# Patient Record
Sex: Female | Born: 1990 | Race: White | Hispanic: No | Marital: Married | Smoking: Never smoker
Health system: Southern US, Community
[De-identification: ages and names within clinical notes are randomized; demographics above are authoritative.]

## PROBLEM LIST (undated history)

## (undated) DIAGNOSIS — I10 Essential (primary) hypertension: Secondary | ICD-10-CM

## (undated) DIAGNOSIS — F32A Depression, unspecified: Secondary | ICD-10-CM

## (undated) DIAGNOSIS — F329 Major depressive disorder, single episode, unspecified: Secondary | ICD-10-CM

## (undated) DIAGNOSIS — F419 Anxiety disorder, unspecified: Secondary | ICD-10-CM

## (undated) DIAGNOSIS — G43909 Migraine, unspecified, not intractable, without status migrainosus: Secondary | ICD-10-CM

## (undated) HISTORY — DX: Migraine, unspecified, not intractable, without status migrainosus: G43.909

## (undated) HISTORY — DX: Depression, unspecified: F32.A

## (undated) HISTORY — DX: Major depressive disorder, single episode, unspecified: F32.9

## (undated) HISTORY — DX: Anxiety disorder, unspecified: F41.9

---
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
## 2014-08-31 LAB — HM HIV SCREENING LAB: HM HIV Screening: NEGATIVE

## 2014-08-31 LAB — HM HEPATITIS C SCREENING LAB: HM HEPATITIS C SCREENING: NEGATIVE

## 2014-08-31 LAB — HM PAP SMEAR

## 2017-12-19 ENCOUNTER — Encounter: Payer: Self-pay | Admitting: Physician Assistant

## 2017-12-19 ENCOUNTER — Other Ambulatory Visit (INDEPENDENT_AMBULATORY_CARE_PROVIDER_SITE_OTHER): Payer: BC Managed Care – PPO

## 2017-12-19 ENCOUNTER — Ambulatory Visit: Payer: BC Managed Care – PPO | Admitting: Physician Assistant

## 2017-12-19 VITALS — BP 140/90 | HR 80 | Temp 98.1°F | Ht 67.0 in | Wt 208.4 lb

## 2017-12-19 DIAGNOSIS — Z23 Encounter for immunization: Secondary | ICD-10-CM

## 2017-12-19 DIAGNOSIS — Z114 Encounter for screening for human immunodeficiency virus [HIV]: Secondary | ICD-10-CM

## 2017-12-19 DIAGNOSIS — Z136 Encounter for screening for cardiovascular disorders: Secondary | ICD-10-CM | POA: Diagnosis not present

## 2017-12-19 DIAGNOSIS — F419 Anxiety disorder, unspecified: Secondary | ICD-10-CM | POA: Diagnosis not present

## 2017-12-19 DIAGNOSIS — Z13220 Encounter for screening for lipoid disorders: Secondary | ICD-10-CM | POA: Diagnosis not present

## 2017-12-19 DIAGNOSIS — F320 Major depressive disorder, single episode, mild: Secondary | ICD-10-CM

## 2017-12-19 DIAGNOSIS — R7309 Other abnormal glucose: Secondary | ICD-10-CM | POA: Diagnosis not present

## 2017-12-19 DIAGNOSIS — Z0000 Encounter for general adult medical examination without abnormal findings: Secondary | ICD-10-CM | POA: Diagnosis not present

## 2017-12-19 DIAGNOSIS — G43809 Other migraine, not intractable, without status migrainosus: Secondary | ICD-10-CM

## 2017-12-19 DIAGNOSIS — E669 Obesity, unspecified: Secondary | ICD-10-CM | POA: Insufficient documentation

## 2017-12-19 LAB — COMPREHENSIVE METABOLIC PANEL
ALBUMIN: 4.5 g/dL (ref 3.5–5.2)
ALK PHOS: 60 U/L (ref 39–117)
ALT: 20 U/L (ref 0–35)
AST: 19 U/L (ref 0–37)
BILIRUBIN TOTAL: 0.4 mg/dL (ref 0.2–1.2)
BUN: 16 mg/dL (ref 6–23)
CO2: 28 mEq/L (ref 19–32)
Calcium: 9.8 mg/dL (ref 8.4–10.5)
Chloride: 104 mEq/L (ref 96–112)
Creatinine, Ser: 0.84 mg/dL (ref 0.40–1.20)
GFR: 85.16 mL/min (ref >60.00)
GLUCOSE: 113 mg/dL — AB (ref 70–99)
Potassium: 4.1 mEq/L (ref 3.5–5.1)
Sodium: 139 mEq/L (ref 135–145)
TOTAL PROTEIN: 7.7 g/dL (ref 6.0–8.3)

## 2017-12-19 LAB — HEMOGLOBIN A1C: HEMOGLOBIN A1C: 5.5 % (ref 4.6–6.5)

## 2017-12-19 LAB — CBC WITH DIFFERENTIAL/PLATELET
BASOS ABS: 0 10*3/uL (ref 0.0–0.1)
Basophils Relative: 0.4 % (ref 0.0–3.0)
EOS PCT: 3.9 % (ref 0.0–5.0)
Eosinophils Absolute: 0.3 10*3/uL (ref 0.0–0.7)
HEMATOCRIT: 39.7 % (ref 36.0–46.0)
HEMOGLOBIN: 13.3 g/dL (ref 12.0–15.0)
LYMPHS ABS: 2.6 10*3/uL (ref 0.7–4.0)
LYMPHS PCT: 36.7 % (ref 12.0–46.0)
MCHC: 33.6 g/dL (ref 30.0–36.0)
MCV: 88.7 fl (ref 78.0–100.0)
MONOS PCT: 7.9 % (ref 3.0–12.0)
Monocytes Absolute: 0.6 10*3/uL (ref 0.1–1.0)
NEUTROS PCT: 51.1 % (ref 43.0–77.0)
Neutro Abs: 3.7 10*3/uL (ref 1.4–7.7)
Platelets: 331 10*3/uL (ref 150.0–400.0)
RBC: 4.48 Mil/uL (ref 3.87–5.11)
RDW: 13.3 % (ref 11.5–15.5)
WBC: 7.2 10*3/uL (ref 4.0–10.5)

## 2017-12-19 LAB — LIPID PANEL
CHOL/HDL RATIO: 6
Cholesterol: 256 mg/dL — ABNORMAL HIGH (ref 0–200)
HDL: 42.5 mg/dL (ref >39.00)
LDL Cholesterol: 185 mg/dL — ABNORMAL HIGH (ref 0–99)
NONHDL: 213.44
Triglycerides: 144 mg/dL (ref 0.0–149.0)
VLDL: 28.8 mg/dL (ref 0.0–40.0)

## 2017-12-19 NOTE — Addendum Note (Signed)
Addended by: Felix Ahmadi A on: 12/19/2017 01:32 PM   Modules accepted: Orders

## 2017-12-19 NOTE — Addendum Note (Signed)
Addended by: Haynes Bast on: 12/19/2017 01:09 PM   Modules accepted: Orders

## 2017-12-19 NOTE — Progress Notes (Signed)
Joann Hebert is a 27 y.o. female here to Establish Care.  I acted as a Neurosurgeon for [employer], PA-C Corky Mull, LPN  History of Present Illness:   Chief Complaint  Patient presents with  . Establish Care    Acute Concerns: None  Chronic Issues: Migraines -- gets approximately 1-2 x a month, takes OTC medication (Advil) and this is sufficient, usually rests in a dark place as well. Was on Imitrex at one point. Anxiety and depression -- in 2015 had an abortion, and since that time has had depression and anxiety. Did see a therapist for a few years in [place], found it helpful. No hx of SI/HI. Did trial Lexapro and Wellbutrin and did not have good results with these.  Health Maintenance: Immunizations -- UTD, will give Tdap today Colonoscopy -- N/A Mammogram -- N/A PAP -- 09/2016, normal per pt. Will obtain records Bone Density -- N/A Diet -- well balanced diet, occasional soda Caffeine intake -- occasional soda Sleep habits -- has occasional night terrors, and takes clonopin (was on [place] briefly) Exercise -- 4/5 x a week, running and cardio, occasional weight training Weight -- Weight: 208 lb 6.1 oz (94.5 kg) , would like to get under 200 lb Mood -- overall good Alcohol -- very rare Tobacco -- never smoker Periods -- normal, very light -- very regular  Patient's last menstrual period was 12/14/2017.  Depression screen PHQ 2/9 12/19/2017  Decreased Interest 0  Down, Depressed, Hopeless 1  PHQ - 2 Score 1  Altered sleeping 2  Tired, decreased energy 1  Change in appetite 1  Feeling bad or failure about yourself  0  Trouble concentrating 0  Moving slowly or fidgety/restless 0  Suicidal thoughts 0  PHQ-9 Score 5  Difficult doing work/chores Not difficult at all    GAD 7 : Generalized Anxiety Score 12/19/2017  Nervous, Anxious, on Edge 1  Control/stop worrying 2  Worry too much - different things 1  Trouble relaxing 1  Restless 0  Easily annoyed or irritable 1   Afraid - awful might happen 0  Total GAD 7 Score 6  Anxiety Difficulty Not difficult at all    Other providers/specialists: None at this time; was seeing ob-gyn, dentist and therapist in [place]   Past Medical History:  Diagnosis Date  . Anxiety   . Depression   . Migraines    has tried imitrex     Social History   Socioeconomic History  . Marital status: Married    Spouse name: Not on file  . Number of children: Not on file  . Years of education: Not on file  . Highest education level: Not on file  Occupational History  . Not on file  Social Needs  . Financial resource strain: Not on file  . Food insecurity:    Worry: Not on file    Inability: Not on file  . Transportation needs:    Medical: Not on file    Non-medical: Not on file  Tobacco Use  . Smoking status: Never Smoker  . Smokeless tobacco: Never Used  Substance and Sexual Activity  . Alcohol use: Yes    Comment: occassionally  . Drug use: Never  . Sexual activity: Yes    Birth control/protection: None  Lifestyle  . Physical activity:    Days per week: Not on file    Minutes per session: Not on file  . Stress: Not on file  Relationships  . Social connections:  Talks on phone: Not on file    Gets together: Not on file    Attends religious service: Not on file    Active member of club or organization: Not on file    Attends meetings of clubs or organizations: Not on file    Relationship status: Not on file  . Intimate partner violence:    Fear of current or ex partner: Not on file    Emotionally abused: Not on file    Physically abused: Not on file    Forced sexual activity: Not on file  Other Topics Concern  . Not on file  Social History Narrative   Engineer, manufacturing systems   From [place]   Married for 1 year   No children but trying    Past Surgical History:  Procedure Laterality Date  . WISDOM TOOTH EXTRACTION Bilateral 2009    Family History  Problem Relation Age of Onset  .  Depression Father   . Diabetes Father   . Heart attack Father   . Hyperlipidemia Father   . Hypertension Father   . Depression Brother   . Alcohol abuse Maternal Grandmother   . Osteoarthritis Maternal Grandmother   . Diabetes Maternal Grandmother   . Hyperlipidemia Maternal Grandmother   . Hypertension Maternal Grandmother   . Miscarriages / Stillbirths Maternal Grandmother   . Diabetes Maternal Grandfather   . Hyperlipidemia Maternal Grandfather   . Hypertension Maternal Grandfather   . Kidney disease Maternal Grandfather   . Depression Paternal Grandfather   . Heart attack Paternal Grandfather   . Colon cancer Neg Hx   . Breast cancer Neg Hx     No Known Allergies   Current Medications:   Current Outpatient Medications:  .  clonazePAM (KLONOPIN) 1 MG tablet, Take 0.5-1 mg by mouth as needed. , Disp: , Rfl:  .  ELDERBERRY PO, Take 2 each by mouth daily., Disp: , Rfl:  .  ibuprofen (ADVIL,MOTRIN) 200 MG tablet, Take 400-600 mg by mouth as needed., Disp: , Rfl:    Review of Systems:   Review of Systems  Constitutional: Negative for chills, fever, malaise/fatigue and weight loss.  HENT: Negative for hearing loss, sinus pain and sore throat.   Respiratory: Negative for cough and hemoptysis.   Cardiovascular: Negative for chest pain, palpitations, leg swelling and PND.  Gastrointestinal: Negative for abdominal pain, constipation, diarrhea, heartburn, nausea and vomiting.  Genitourinary: Negative for dysuria, frequency and urgency.  Musculoskeletal: Negative for back pain, myalgias and neck pain.  Skin: Negative for itching and rash.  Neurological: Negative for dizziness, tingling, seizures and headaches.  Endo/Heme/Allergies: Negative for polydipsia.  Psychiatric/Behavioral: Negative for depression. The patient is not nervous/anxious.     Vitals:   Vitals:   12/19/17 0751  BP: 140/90  Pulse: 80  Temp: 98.1 F (36.7 C)  TempSrc: Oral  SpO2: 96%  Weight: 208 lb  6.1 oz (94.5 kg)  Height: 5\' 7"  (1.702 m)     Body mass index is 32.64 kg/m.  Physical Exam:   Physical Exam  Constitutional: She appears well-developed and well-nourished. She is cooperative.  Non-toxic appearance. She does not have a sickly appearance. She does not appear ill. No distress.  HENT:  Head: Normocephalic and atraumatic.  Right Ear: Tympanic membrane, external ear and ear canal normal. Tympanic membrane is not erythematous, not retracted and not bulging.  Left Ear: Tympanic membrane, external ear and ear canal normal. Tympanic membrane is not erythematous, not retracted and not bulging.  Eyes: Pupils are equal, round, and reactive to light. Conjunctivae, EOM and lids are normal.  Neck: Trachea normal and full passive range of motion without pain.  Cardiovascular: Normal rate, regular rhythm, S1 normal, S2 normal, normal heart sounds and intact distal pulses.  Pulmonary/Chest: Effort normal and breath sounds normal. No tachypnea. No respiratory distress. She has no decreased breath sounds. She has no wheezes. She has no rhonchi. She has no rales.  Abdominal: Soft. Normal appearance and bowel sounds are normal. There is no tenderness.  Genitourinary:  Genitourinary Comments: Did not perform today  Musculoskeletal: Normal range of motion.  Lymphadenopathy:    She has no cervical adenopathy.  Neurological: She is alert. She has normal reflexes. No cranial nerve deficit or sensory deficit. GCS eye subscore is 4. GCS verbal subscore is 5. GCS motor subscore is 6.  Skin: Skin is warm, dry and intact.  Psychiatric: She has a normal mood and affect. Her speech is normal and behavior is normal.  Nursing note and vitals reviewed.   Assessment and Plan:    Armelia was seen today for establish care.  Diagnoses and all orders for this visit:  Routine physical examination Today patient counseled on age appropriate routine health concerns for screening and prevention, each reviewed  and up to date or declined. Immunizations reviewed and up to date or declined. Labs ordered and reviewed. Risk factors for depression reviewed and negative. Hearing function and visual acuity are intact. ADLs screened and addressed as needed. Functional ability and level of safety reviewed and appropriate. Education, counseling and referrals performed based on assessed risks today. Patient provided with a copy of personalized plan for preventive services.  Need for prophylactic vaccination with combined diphtheria-tetanus-pertussis (DTP) vaccine -     Tdap vaccine greater than or equal to 7yo IM  Anxiety; Depression, major, single episode, mild (HCC) Managed with prn clonopin. Does not need refill, will return for refill when she is due. Encouraged her to find a therapist, provided list of recommendations. -     CBC with Differential/Platelet -     Comprehensive metabolic panel  Encounter for lipid screening for cardiovascular disease -     Lipid panel  Screening for HIV (human immunodeficiency virus) -     HIV Antibody (routine testing w rflx)   Obesity, unspecified classification, unspecified obesity type, unspecified whether serious comorbidity present Has increased her physical activity. Commended her on changes and weight loss made thus far.  Other migraine without status migrainosus, not intractable Well controlled presently without rx.  . Reviewed expectations re: course of current medical issues. . Discussed self-management of symptoms. . Outlined signs and symptoms indicating need for more acute intervention. . Patient verbalized understanding and all questions were answered. . See orders for this visit as documented in the electronic medical record. . Patient received an After-Visit Summary.  CMA or LPN served as scribe during this visit. History, Physical, and Plan performed by medical provider. The above documentation has been reviewed and is accurate and  complete.  Jarold Motto, PA-C

## 2017-12-19 NOTE — Patient Instructions (Signed)
It was great to see you!  Please go to the lab for blood work.   Our office will call you with your results unless you have chosen to receive results via MyChart.  If your blood work is normal we will follow-up each year for physicals and as scheduled for chronic medical problems. Please schedule a visit in our office when you are ready to refill your klonopin. Please work on finding a therapist.  If anything is abnormal we will treat accordingly and get you in for a follow-up.  Take care,  [facility] Maintenance, Female Adopting a healthy lifestyle and getting preventive care can go a long way to promote health and wellness. Talk with your health care provider about what schedule of regular examinations is right for you. This is a good chance for you to check in with your provider about disease prevention and staying healthy. In between checkups, there are plenty of things you can do on your own. Experts have done a lot of research about which lifestyle changes and preventive measures are most likely to keep you healthy. Ask your health care provider for more information. Weight and diet Eat a healthy diet  Be sure to include plenty of vegetables, fruits, low-fat dairy products, and lean protein.  Do not eat a lot of foods high in solid fats, added sugars, or salt.  Get regular exercise. This is one of the most important things you can do for your health. ? Most adults should exercise for at least 150 minutes each week. The exercise should increase your heart rate and make you sweat (moderate-intensity exercise). ? Most adults should also do strengthening exercises at least twice a week. This is in addition to the moderate-intensity exercise.  Maintain a healthy weight  Body mass index (BMI) is a measurement that can be used to identify possible weight problems. It estimates body fat based on height and weight. Your health care provider can help determine your BMI and help you  achieve or maintain a healthy weight.  For females 78 years of age and older: ? A BMI below 18.5 is considered underweight. ? A BMI of 18.5 to 24.9 is normal. ? A BMI of 25 to 29.9 is considered overweight. ? A BMI of 30 and above is considered obese.  Watch levels of cholesterol and blood lipids  You should start having your blood tested for lipids and cholesterol at 29 years of age, then have this test every 5 years.  You may need to have your cholesterol levels checked more often if: ? Your lipid or cholesterol levels are high. ? You are older than 29 years of age. ? You are at high risk for heart disease.  Cancer screening Lung Cancer  Lung cancer screening is recommended for adults 31-39 years old who are at high risk for lung cancer because of a history of smoking.  A yearly low-dose CT scan of the lungs is recommended for people who: ? Currently smoke. ? Have quit within the past 15 years. ? Have at least a 30-pack-year history of smoking. A pack year is smoking an average of one pack of cigarettes a day for 1 year.  Yearly screening should continue until it has been 15 years since you quit.  Yearly screening should stop if you develop a health problem that would prevent you from having lung cancer treatment.  Breast Cancer  Practice breast self-awareness. This means understanding how your breasts normally appear and feel.  It  also means doing regular breast self-exams. Let your health care provider know about any changes, no matter how small.  If you are in your 20s or 30s, you should have a clinical breast exam (CBE) by a health care provider every 1-3 years as part of a regular health exam.  If you are 8 or older, have a CBE every year. Also consider having a breast X-ray (mammogram) every year.  If you have a family history of breast cancer, talk to your health care provider about genetic screening.  If you are at high risk for breast cancer, talk to your health  care provider about having an MRI and a mammogram every year.  Breast cancer gene (BRCA) assessment is recommended for women who have family members with BRCA-related cancers. BRCA-related cancers include: ? Breast. ? Ovarian. ? Tubal. ? Peritoneal cancers.  Results of the assessment will determine the need for genetic counseling and BRCA1 and BRCA2 testing.  Cervical Cancer Your health care provider may recommend that you be screened regularly for cancer of the pelvic organs (ovaries, uterus, and vagina). This screening involves a pelvic examination, including checking for microscopic changes to the surface of your cervix (Pap test). You may be encouraged to have this screening done every 3 years, beginning at age 26.  For women ages 58-65, health care providers may recommend pelvic exams and Pap testing every 3 years, or they may recommend the Pap and pelvic exam, combined with testing for human papilloma virus (HPV), every 5 years. Some types of HPV increase your risk of cervical cancer. Testing for HPV may also be done on women of any age with unclear Pap test results.  Other health care providers may not recommend any screening for nonpregnant women who are considered low risk for pelvic cancer and who do not have symptoms. Ask your health care provider if a screening pelvic exam is right for you.  If you have had past treatment for cervical cancer or a condition that could lead to cancer, you need Pap tests and screening for cancer for at least 20 years after your treatment. If Pap tests have been discontinued, your risk factors (such as having a new sexual partner) need to be reassessed to determine if screening should resume. Some women have medical problems that increase the chance of getting cervical cancer. In these cases, your health care provider may recommend more frequent screening and Pap tests.  Colorectal Cancer  This type of cancer can be detected and often  prevented.  Routine colorectal cancer screening usually begins at 29 years of age and continues through 29 years of age.  Your health care provider may recommend screening at an earlier age if you have risk factors for colon cancer.  Your health care provider may also recommend using home test kits to check for hidden blood in the stool.  A small camera at the end of a tube can be used to examine your colon directly (sigmoidoscopy or colonoscopy). This is done to check for the earliest forms of colorectal cancer.  Routine screening usually begins at age 18.  Direct examination of the colon should be repeated every 5-10 years through 29 years of age. However, you may need to be screened more often if early forms of precancerous polyps or small growths are found.  Skin Cancer  Check your skin from head to toe regularly.  Tell your health care provider about any new moles or changes in moles, especially if there is a change  in a mole's shape or color.  Also tell your health care provider if you have a mole that is larger than the size of a pencil eraser.  Always use sunscreen. Apply sunscreen liberally and repeatedly throughout the day.  Protect yourself by wearing long sleeves, pants, a wide-brimmed hat, and sunglasses whenever you are outside.  Heart disease, diabetes, and high blood pressure  High blood pressure causes heart disease and increases the risk of stroke. High blood pressure is more likely to develop in: ? People who have blood pressure in the high end of the normal range (130-139/85-89 mm Hg). ? People who are overweight or obese. ? People who are African American.  If you are 67-49 years of age, have your blood pressure checked every 3-5 years. If you are 41 years of age or older, have your blood pressure checked every year. You should have your blood pressure measured twice-once when you are at a hospital or clinic, and once when you are not at a hospital or clinic.  Record the average of the two measurements. To check your blood pressure when you are not at a hospital or clinic, you can use: ? An automated blood pressure machine at a pharmacy. ? A home blood pressure monitor.  If you are between 11 years and 39 years old, ask your health care provider if you should take aspirin to prevent strokes.  Have regular diabetes screenings. This involves taking a blood sample to check your fasting blood sugar level. ? If you are at a normal weight and have a low risk for diabetes, have this test once every three years after 29 years of age. ? If you are overweight and have a high risk for diabetes, consider being tested at a younger age or more often. Preventing infection Hepatitis B  If you have a higher risk for hepatitis B, you should be screened for this virus. You are considered at high risk for hepatitis B if: ? You were born in a country where hepatitis B is common. Ask your health care provider which countries are considered high risk. ? Your parents were born in a high-risk country, and you have not been immunized against hepatitis B (hepatitis B vaccine). ? You have HIV or AIDS. ? You use needles to inject street drugs. ? You live with someone who has hepatitis B. ? You have had sex with someone who has hepatitis B. ? You get hemodialysis treatment. ? You take certain medicines for conditions, including cancer, organ transplantation, and autoimmune conditions.  Hepatitis C  Blood testing is recommended for: ? Everyone born from [birth year] through 1967. ? Anyone with known risk factors for hepatitis C.  Sexually transmitted infections (STIs)  You should be screened for sexually transmitted infections (STIs) including gonorrhea and chlamydia if: ? You are sexually active and are younger than 29 years of age. ? You are older than 29 years of age and your health care provider tells you that you are at risk for this type of infection. ? Your sexual  activity has changed since you were last screened and you are at an increased risk for chlamydia or gonorrhea. Ask your health care provider if you are at risk.  If you do not have HIV, but are at risk, it may be recommended that you take a prescription medicine daily to prevent HIV infection. This is called pre-exposure prophylaxis (PrEP). You are considered at risk if: ? You are sexually active and do not regularly use  condoms or know the HIV status of your partner(s). ? You take drugs by injection. ? You are sexually active with a partner who has HIV.  Talk with your health care provider about whether you are at high risk of being infected with HIV. If you choose to begin PrEP, you should first be tested for HIV. You should then be tested every 3 months for as long as you are taking PrEP. Pregnancy  If you are premenopausal and you may become pregnant, ask your health care provider about preconception counseling.  If you may become pregnant, take 400 to 800 micrograms (mcg) of folic acid every day.  If you want to prevent pregnancy, talk to your health care provider about birth control (contraception). Osteoporosis and menopause  Osteoporosis is a disease in which the bones lose minerals and strength with aging. This can result in serious bone fractures. Your risk for osteoporosis can be identified using a bone density scan.  If you are 56 years of age or older, or if you are at risk for osteoporosis and fractures, ask your health care provider if you should be screened.  Ask your health care provider whether you should take a calcium or vitamin D supplement to lower your risk for osteoporosis.  Menopause may have certain physical symptoms and risks.  Hormone replacement therapy may reduce some of these symptoms and risks. Talk to your health care provider about whether hormone replacement therapy is right for you. Follow these instructions at home:  Schedule regular health, dental,  and eye exams.  Stay current with your immunizations.  Do not use any tobacco products including cigarettes, chewing tobacco, or electronic cigarettes.  If you are pregnant, do not drink alcohol.  If you are breastfeeding, limit how much and how often you drink alcohol.  Limit alcohol intake to no more than 1 drink per day for nonpregnant women. One drink equals 12 ounces of beer, 5 ounces of wine, or 1 ounces of hard liquor.  Do not use street drugs.  Do not share needles.  Ask your health care provider for help if you need support or information about quitting drugs.  Tell your health care provider if you often feel depressed.  Tell your health care provider if you have ever been abused or do not feel safe at home. This information is not intended to replace advice given to you by your health care provider. Make sure you discuss any questions you have with your health care provider. Document Released: 09/12/2010 Document Revised: 08/05/2015 Document Reviewed: 12/01/2014 Elsevier Interactive Patient Education  [employer].

## 2017-12-20 ENCOUNTER — Encounter: Payer: Self-pay | Admitting: Physician Assistant

## 2017-12-20 DIAGNOSIS — E785 Hyperlipidemia, unspecified: Secondary | ICD-10-CM | POA: Insufficient documentation

## 2017-12-20 LAB — HIV ANTIBODY (ROUTINE TESTING W REFLEX): HIV: NONREACTIVE

## 2018-01-28 ENCOUNTER — Encounter: Payer: Self-pay | Admitting: Physician Assistant

## 2018-01-28 LAB — HEPATITIS B SURFACE ANTIGEN: HEPATITIS B SURFACE ANTIGEN: NONREACTIVE

## 2018-01-28 LAB — RPR: RPR Screen: NONREACTIVE

## 2018-02-11 ENCOUNTER — Encounter: Payer: Self-pay | Admitting: Physician Assistant

## 2018-02-11 ENCOUNTER — Ambulatory Visit: Payer: BC Managed Care – PPO | Admitting: Physician Assistant

## 2018-02-11 VITALS — BP 126/80 | HR 87 | Temp 98.9°F | Ht 67.0 in | Wt 208.0 lb

## 2018-02-11 DIAGNOSIS — Z3A01 Less than 8 weeks gestation of pregnancy: Secondary | ICD-10-CM

## 2018-02-11 DIAGNOSIS — R05 Cough: Secondary | ICD-10-CM | POA: Diagnosis not present

## 2018-02-11 DIAGNOSIS — R059 Cough, unspecified: Secondary | ICD-10-CM

## 2018-02-11 NOTE — Patient Instructions (Signed)
It was great to see you!  Your estimated due date is around August 7.  Avoid ibuprofen and klonopin.  Trial Delsym (or store brand equivalent) to see if this helps with your symptoms. Let me know if it doesn't.  Take care,  Jarold MottoSamantha Tamina Cyphers PA-C

## 2018-02-11 NOTE — Progress Notes (Signed)
Joann Hebert is a 27 y.o. female here for a new problem.  I acted as a Neurosurgeon for [employer], PA-C Corky Mull, LPN  History of Present Illness:   Chief Complaint  Patient presents with  . Cough    Cough  This is a new problem. Episode onset: Started 3 weeks ago. The problem has been gradually improving. The problem occurs every few hours. The cough is non-productive. Associated symptoms include shortness of breath (during coughing episodes). Pertinent negatives include no chills, ear congestion, ear pain, fever, headaches, nasal congestion, postnasal drip, sore throat or wheezing. The symptoms are aggravated by lying down. Risk factors for lung disease include occupational exposure. She has tried OTC cough suppressant for the symptoms. The treatment provided mild relief. Her past medical history is significant for bronchitis and pneumonia (x 1). There is no history of asthma.   Patient's last menstrual period was 01/10/2018.  Patient took 3 pregnancy tests on Saturday all of which were positive.  She is not trying to get pregnant however she states that they were also not preventing pregnancy.  She does have history of an abortion in the past.  She does not currently have an OB/GYN in this area.  She denies any bleeding, cramping, or other concerning symptoms.  She has not taken any ibuprofen or Klonopin since she is found that she is pregnant.   Past Medical History:  Diagnosis Date  . Anxiety   . Depression   . Migraines    has tried imitrex     Social History   Socioeconomic History  . Marital status: Married    Spouse name: Not on file  . Number of children: Not on file  . Years of education: Not on file  . Highest education level: Not on file  Occupational History  . Not on file  Social Needs  . Financial resource strain: Not on file  . Food insecurity:    Worry: Not on file    Inability: Not on file  . Transportation needs:    Medical: Not on file   Non-medical: Not on file  Tobacco Use  . Smoking status: Never Smoker  . Smokeless tobacco: Never Used  Substance and Sexual Activity  . Alcohol use: Yes    Comment: occassionally  . Drug use: Never  . Sexual activity: Yes    Birth control/protection: None  Lifestyle  . Physical activity:    Days per week: Not on file    Minutes per session: Not on file  . Stress: Not on file  Relationships  . Social connections:    Talks on phone: Not on file    Gets together: Not on file    Attends religious service: Not on file    Active member of club or organization: Not on file    Attends meetings of clubs or organizations: Not on file    Relationship status: Not on file  . Intimate partner violence:    Fear of current or ex partner: Not on file    Emotionally abused: Not on file    Physically abused: Not on file    Forced sexual activity: Not on file  Other Topics Concern  . Not on file  Social History Narrative   Engineer, manufacturing systems   From [place]   Married for 1 year   No children but trying    Past Surgical History:  Procedure Laterality Date  . WISDOM TOOTH EXTRACTION Bilateral 2009    Family History  Problem Relation Age of Onset  . Depression Father   . Diabetes Father   . Heart attack Father   . Hyperlipidemia Father   . Hypertension Father   . Depression Brother   . Alcohol abuse Maternal Grandmother   . Osteoarthritis Maternal Grandmother   . Diabetes Maternal Grandmother   . Hyperlipidemia Maternal Grandmother   . Hypertension Maternal Grandmother   . Miscarriages / Stillbirths Maternal Grandmother   . Diabetes Maternal Grandfather   . Hyperlipidemia Maternal Grandfather   . Hypertension Maternal Grandfather   . Kidney disease Maternal Grandfather   . Depression Paternal Grandfather   . Heart attack Paternal Grandfather   . Colon cancer Neg Hx   . Breast cancer Neg Hx     No Known Allergies  Current Medications:   Current Outpatient  Medications:  .  ELDERBERRY PO, Take 2 each by mouth daily., Disp: , Rfl:    Review of Systems:   Review of Systems  Constitutional: Negative for chills and fever.  HENT: Negative for ear pain, postnasal drip and sore throat.   Respiratory: Positive for cough and shortness of breath (during coughing episodes). Negative for wheezing.   Neurological: Negative for headaches.    Vitals:   Vitals:   02/11/18 1559  BP: 126/80  Pulse: 87  Temp: 98.9 F (37.2 C)  TempSrc: Oral  SpO2: 97%  Weight: 208 lb (94.3 kg)  Height: 5\' 7"  (1.702 m)     Body mass index is 32.58 kg/m.  Physical Exam:   Physical Exam  Constitutional: She appears well-developed. She is cooperative.  Non-toxic appearance. She does not have a sickly appearance. She does not appear ill. No distress.  HENT:  Head: Normocephalic and atraumatic.  Right Ear: Tympanic membrane, external ear and ear canal normal. Tympanic membrane is not erythematous, not retracted and not bulging.  Left Ear: Tympanic membrane, external ear and ear canal normal. Tympanic membrane is not erythematous, not retracted and not bulging.  Nose: Mucosal edema and rhinorrhea present. Right sinus exhibits no maxillary sinus tenderness and no frontal sinus tenderness. Left sinus exhibits no maxillary sinus tenderness and no frontal sinus tenderness.  Mouth/Throat: Uvula is midline and mucous membranes are normal. Posterior oropharyngeal erythema present. No posterior oropharyngeal edema. Tonsils are 0 on the right. Tonsils are 0 on the left.  Eyes: Conjunctivae and lids are normal.  Neck: Trachea normal.  Cardiovascular: Normal rate, regular rhythm, S1 normal, S2 normal and normal heart sounds.  Pulmonary/Chest: Effort normal and breath sounds normal. She has no decreased breath sounds. She has no wheezes. She has no rhonchi. She has no rales.  Lymphadenopathy:    She has no cervical adenopathy.  Neurological: She is alert.  Skin: Skin is warm,  dry and intact.  Psychiatric: She has a normal mood and affect. Her speech is normal and behavior is normal.  Nursing note and vitals reviewed.     Assessment and Plan:   Carisma was seen today for cough.  Diagnoses and all orders for this visit:  Cough No red flags on exam. Suspect likely viral bronchitis.  Will initiate supportive care -- provided list medications safe for pregnancy. Reviewed return precautions including worsening fever, SOB, worsening cough or other concerns. Push fluids and rest. I recommend that patient follow-up if symptoms worsen or persist despite treatment x 7-10 days, sooner if needed.  Less than [redacted] weeks gestation of pregnancy Provided list of recommended ob-gyns. Start prenatal. Avoid klonopin and ibuprofen. [facility] if any issues arise prior to her establishing with an ob-gyn.  . Reviewed expectations re: course of current medical issues. . Discussed self-management of symptoms. . Outlined signs and symptoms indicating need for more acute intervention. . Patient verbalized understanding and all questions were answered. . See orders for this visit as documented in the electronic medical record. . Patient received an After-Visit Summary   CMA or LPN served as scribe during this visit. History, Physical, and Plan performed by medical provider. The above documentation has been reviewed and is accurate and complete.  Jarold MottoSamantha Llewelyn Sheaffer, PA-C

## 2018-03-13 NOTE — L&D Delivery Note (Signed)
Delivery Note At 9:43 AM a viable and healthy female was delivered via Vaginal, Spontaneous (Presentation: LOA ).  APGAR: 8, 9; weight  pending.   Placenta status: ,spontaneous .intact  Cord:  with the following complications: none.  Cord pH: na  Anesthesia:  epidural Episiotomy:  na Lacerations:  second Suture Repair: 2.0 vicryl rapide Est. Blood Loss (mL):  352  Mom to postpartum.  Baby to Couplet care / Skin to Skin.  Fable Huisman J 09/21/2018, 10:14 AM

## 2018-03-26 LAB — OB RESULTS CONSOLE HIV ANTIBODY (ROUTINE TESTING): HIV: NONREACTIVE

## 2018-03-26 LAB — OB RESULTS CONSOLE ABO/RH: RH Type: NEGATIVE

## 2018-03-26 LAB — OB RESULTS CONSOLE GC/CHLAMYDIA
Chlamydia: NEGATIVE
Gonorrhea: NEGATIVE

## 2018-03-26 LAB — OB RESULTS CONSOLE RUBELLA ANTIBODY, IGM: Rubella: IMMUNE

## 2018-03-26 LAB — OB RESULTS CONSOLE HEPATITIS B SURFACE ANTIGEN: Hepatitis B Surface Ag: NEGATIVE

## 2018-03-26 LAB — OB RESULTS CONSOLE RPR: RPR: NONREACTIVE

## 2018-06-26 ENCOUNTER — Telehealth: Payer: Self-pay | Admitting: *Deleted

## 2018-06-26 NOTE — Telephone Encounter (Signed)
Left message on voicemail to call office. Needs f/u for Anxiety and Depression.

## 2018-06-26 NOTE — Telephone Encounter (Signed)
Pt called back appt scheduled for 4/20 at 11:00 AM with Samantha.

## 2018-07-01 ENCOUNTER — Encounter: Payer: Self-pay | Admitting: Physician Assistant

## 2018-07-01 ENCOUNTER — Ambulatory Visit (INDEPENDENT_AMBULATORY_CARE_PROVIDER_SITE_OTHER): Payer: BC Managed Care – PPO | Admitting: Physician Assistant

## 2018-07-01 DIAGNOSIS — F419 Anxiety disorder, unspecified: Secondary | ICD-10-CM

## 2018-07-01 DIAGNOSIS — F320 Major depressive disorder, single episode, mild: Secondary | ICD-10-CM

## 2018-07-01 NOTE — Progress Notes (Signed)
Virtual Visit via Video   I connected with Joann Hebert on 07/01/18 at 11:00 AM EDT by a video enabled telemedicine application and verified that I am speaking with the correct person using two identifiers. Location patient: Home Location provider: [facility], Office Persons participating in the virtual visit: Ellwood SayersRachel Neu, Chance Munter, PA-C  I discussed the limitations of evaluation and management by telemedicine and the availability of in person appointments. The patient expressed understanding and agreed to proceed.  Subjective:   HPI:   Patient presents for f/u on anxiety and depression. In the past she has been managed with prn Klonopin. She is currently [redacted] weeks pregnant and doing very well, due in August. The only significant change in her mood recently has been working from home. 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 Otherwise, she is doing well. Has good support system.   GAD 7 : Generalized Anxiety Score 07/01/2018 12/19/2017  Nervous, Anxious, on Edge 1 1  Control/stop worrying 1 2  Worry too much - different things 0 1  Trouble relaxing 1 1  Restless 2 0  Easily annoyed or irritable 1 1  Afraid - awful might happen 0 0  Total GAD 7 Score 6 6  Anxiety Difficulty Not difficult at all Not difficult at all   [facility] 07/01/2018 12/19/2017  Decreased Interest 1 0  Down, Depressed, Hopeless 0 1  PHQ - 2 Score 1 1  Altered sleeping 2 2  Tired, decreased energy 1 1  Change in appetite 1 1  Feeling bad or failure about yourself  0 0  Trouble concentrating 1 0  Moving slowly or fidgety/restless 0 0  Suicidal thoughts 0 0  PHQ-9 Score 6 5  Difficult doing work/chores Not difficult at all Not difficult at all     ROS: See pertinent positives and negatives per HPI.  Patient Active Problem List   Diagnosis Date Noted  . Hyperlipidemia 12/20/2017  . Obesity 12/19/2017  . Anxiety 12/19/2017  . Depression, major, single  episode, mild (HCC) 12/19/2017    Social History   Tobacco Use  . Smoking status: Never Smoker  . Smokeless tobacco: Never Used  Substance Use Topics  . Alcohol use: Yes    Comment: occassionally    Current Outpatient Medications:  .  Prenat w/o A-FeCbGl-DSS-FA-DHA (CITRANATAL ASSURE) 35-1 & 300 MG tablet, , Disp: , Rfl:   No Known Allergies  Objective:   VITALS: Per patient if applicable, see vitals. GENERAL: Alert, appears well and in no acute distress. HEENT: Atraumatic, conjunctiva clear, no obvious abnormalities on inspection of external nose and ears. NECK: Normal movements of the head and neck. CARDIOPULMONARY: No increased WOB. Speaking in clear sentences. I:E ratio WNL.  MS: Moves all visible extremities without noticeable abnormality. PSYCH: Pleasant and cooperative, well-groomed. Speech normal rate and rhythm. Affect is appropriate. Insight and judgement are appropriate. Attention is focused, linear, and appropriate.  NEURO: CN grossly intact. Oriented as arrived to appointment on time with no prompting. Moves both UE equally.  SKIN: No obvious lesions, wounds, erythema, or cyanosis noted on face or hands.  Assessment and Plan:   Fleet ContrasRachel was seen today for anxiety and depression.  Diagnoses and all orders for this visit:  Depression, major, single episode, mild (HCC)  Anxiety   Currently controlled. She is going on walks daily - continue as able with social distancing measures. No need identified at this time. Close follow-up with us if needed  if anything changes.   . Reviewed expectations re: course of current medical issues. . Discussed self-management of symptoms. . Outlined signs and symptoms indicating need for more acute intervention. . Patient verbalized understanding and all questions were answered. Marland Kitchen Health Maintenance issues including appropriate healthy diet, exercise, and smoking avoidance were discussed with patient. . See orders for this visit as  documented in the electronic medical record.  I discussed the assessment and treatment plan with the patient. The patient was provided an opportunity to ask questions and all were answered. The patient agreed with the plan and demonstrated an understanding of the instructions.   The patient was advised to call back or seek an in-person evaluation if the symptoms worsen or if the condition fails to improve as anticipated.       [place], [place] 07/01/2018

## 2018-08-19 ENCOUNTER — Ambulatory Visit (INDEPENDENT_AMBULATORY_CARE_PROVIDER_SITE_OTHER): Payer: BC Managed Care – PPO | Admitting: Physician Assistant

## 2018-08-19 ENCOUNTER — Telehealth: Payer: Self-pay | Admitting: *Deleted

## 2018-08-19 ENCOUNTER — Ambulatory Visit: Payer: Self-pay

## 2018-08-19 ENCOUNTER — Telehealth: Payer: Self-pay | Admitting: Physician Assistant

## 2018-08-19 ENCOUNTER — Encounter: Payer: Self-pay | Admitting: Physician Assistant

## 2018-08-19 VITALS — Ht 67.0 in | Wt 235.0 lb

## 2018-08-19 DIAGNOSIS — Z7189 Other specified counseling: Secondary | ICD-10-CM | POA: Diagnosis not present

## 2018-08-19 DIAGNOSIS — Z20822 Contact with and (suspected) exposure to covid-19: Secondary | ICD-10-CM

## 2018-08-19 DIAGNOSIS — Z20828 Contact with and (suspected) exposure to other viral communicable diseases: Secondary | ICD-10-CM

## 2018-08-19 NOTE — Telephone Encounter (Signed)
Let message for pt to CB for covid-19 testing/scheduling.  Requested by Ewell Poe, PA-C  [place]  Pt with direct exposure, 8 months pregnant. No symptoms.  CB# [phone]

## 2018-08-19 NOTE — Telephone Encounter (Signed)
Please call patient and schedule outpatient COVID-19 test.  Thanks!  Inda Coke PA-C

## 2018-08-19 NOTE — Progress Notes (Signed)
Virtual Visit via Video   I connected with Joann Hebert on 08/19/18 at  3:40 PM EDT by a video enabled telemedicine application and verified that I am speaking with the correct person using two identifiers. Location patient: Home Location provider:  [facility], Office Persons participating in the virtual visit: Joann Hebert, Beverlie Kurihara PA-C, Corky Mullonna Orphanos, LPN   I discussed the limitations of evaluation and management by telemedicine and the availability of in person appointments. The patient expressed understanding and agreed to proceed.  Subjective:   HPI:  COVID-19 testing Pt would like COVID 19 testing. She said her father-in-law tested positive this morning for COVID 19, after spending about 4 hrs. with her husband on Saturday, while golfing. Pt is 8 mos. Pregnant. Denied that her husband has had any symptoms.  She denied having any symptoms except backache, and stated she attributes that to being 8 mos. Pregnant.  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    She is currently asymptomatic.  ROS: See pertinent positives and negatives per HPI.  Patient Active Problem List   Diagnosis Date Noted  . Hyperlipidemia 12/20/2017  . Obesity 12/19/2017  . Anxiety 12/19/2017  . 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 Smoking status: Never Smoker  . Smokeless tobacco: Never Used  Substance Use Topics  . Alcohol use: Yes    Comment: occassionally    Current Outpatient Medications:  .  Prenat w/o A-FeCbGl-DSS-FA-DHA (CITRANATAL ASSURE) 35-1 & 300 MG tablet, , Disp: , Rfl:  .  sertraline (ZOLOFT) 50 MG tablet, , Disp: , Rfl:   No Known Allergies  Objective:   VITALS: Per patient if applicable, see vitals. GENERAL: Alert, appears well and in no acute distress. HEENT: Atraumatic, conjunctiva clear, no obvious  abnormalities on inspection of external nose and ears. NECK: Normal movements of the head and neck. CARDIOPULMONARY: No increased WOB. Speaking in clear sentences. I:E ratio WNL.  MS: Moves all visible extremities without noticeable abnormality. PSYCH: Pleasant and cooperative, well-groomed. Speech normal rate and rhythm. Affect is appropriate. Insight and judgement are appropriate. Attention is focused, linear, and appropriate.  NEURO: CN grossly intact. Oriented as arrived to appointment on time with no prompting. Moves both UE equally.  SKIN: No obvious lesions, wounds, erythema, or cyanosis noted on face or hands.  Assessment and Plan:   Fleet ContrasRachel was seen today for covid-19 testing.  Diagnoses and all orders for this visit:  Close Exposure to Covid-19 Virus  Advice Given About Covid-19 Virus Infection   No red flags on discussion with patient. She would like COVID-19 testing and I am in agreement given pregnancy status. [facility].   Any severe symptoms -- patient was instructed to go to the ER.  . Reviewed expectations re: course of current medical issues. . Discussed self-management of symptoms. . Outlined signs and symptoms indicating need for more acute intervention. . Patient verbalized understanding and all questions were answered. Marland Kitchen. Health Maintenance issues including appropriate healthy diet, exercise, and smoking avoidance were discussed with patient. . See orders for this visit as documented in the electronic medical record.  I discussed the assessment and treatment plan with the patient. The patient was provided an opportunity to ask questions and all were answered. The patient agreed with the plan  and demonstrated an understanding of the instructions.   The patient was advised to call back or seek an in-person evaluation if the symptoms worsen or if the condition fails to improve as anticipated.   CMA or LPN served as  scribe during this visit. History, Physical, and Plan performed by medical provider. The above documentation has been reviewed and is accurate and complete.  [place], [place] 08/19/2018

## 2018-08-19 NOTE — Telephone Encounter (Signed)
See note

## 2018-08-19 NOTE — Telephone Encounter (Signed)
Pt. called to inquire about COVID 19 testing.  Reported her father-in-law tested positive this morning for COVID 19, after spending about 4 hrs. with her husband on Saturday, while golfing.  Stated she is 8 mos. Pregnant.  Denied that her husband has had any symptoms.  She denied having any symptoms.  Admitted to backache, and stated she attributes that to being 8 mos. Pregnant.  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   Called FC; transferred pt. To the office for scheduling a virtual visit.  Pt. Agreed with plan.      Reason for Disposition . [1] COVID-19 EXPOSURE (Close Contact) within last 14 days AND [2] needs COVID-19 lab test to return to work AND [3] NO symptoms    Pt's husband was with his father x 4 hrs. On 08/17/18 during a golf outing; road with father in golf cart; patients father-in-law tested positive for COVID today.  Answer Assessment - Initial Assessment Questions 1. CLOSE CONTACT: "Who is the person with the confirmed or suspected COVID-19 infection that you were exposed to?"     Father- in - law  2. PLACE of CONTACT: "Where were you when you were exposed to COVID-19?" (e.g., home, school, medical waiting room; which city?)     Husband played golf with his father on Sat.  3. TYPE of CONTACT: "How much contact was there?" (e.g., sitting next to, live in same house, work in same office, same building)    Close proximity in golf cart  4. DURATION of CONTACT: "How long were you in contact with the COVID-19 patient?" (e.g., a few seconds, passed by person, a few minutes, live with the patient)    About 4 hrs.  5. DATE of CONTACT: "When did you have contact with a COVID-19 patient?" (e.g., how many days ago)     08/17/18 6. TRAVEL: "Have you traveled out of the country recently?" If so, "When and where?"     * Also ask about out-of-state travel, since the CDC has identified some high-risk  cities for community spread in the [place].     * Note: Travel becomes less relevant if there is widespread community transmission where the patient lives.     Denied travel 7. COMMUNITY SPREAD: "Are there lots of cases of COVID-19 (community spread) where you live?" (See public health department website, if unsure)       Present in community 8. SYMPTOMS: "Do you have any symptoms?" (e.g., fever, cough, breathing difficulty)     Pt. Denied any symptoms  9. PREGNANCY OR POSTPARTUM: "Is there any chance you are pregnant?" "When was your last menstrual period?" "Did you deliver in the last 2 weeks?"     8 mos. Pregnant 10. HIGH RISK: "Do you have any heart or lung problems? Do you have a weak immune system?" (e.g., CHF, COPD, asthma, HIV positive, chemotherapy, renal failure, diabetes mellitus, sickle cell anemia)       No medical issues; suffers from anxiety and depression  Protocols used: CORONAVIRUS (COVID-19) EXPOSURE-A-AH

## 2018-08-20 ENCOUNTER — Other Ambulatory Visit: Payer: BC Managed Care – PPO

## 2018-08-20 ENCOUNTER — Telehealth: Payer: Self-pay | Admitting: *Deleted

## 2018-08-20 DIAGNOSIS — Z20822 Contact with and (suspected) exposure to covid-19: Secondary | ICD-10-CM

## 2018-08-20 NOTE — Telephone Encounter (Signed)
Referred by Inda Coke, PA due to possible exposure and being 8 months pregnant. Appointment made for today at [facility] site at 2:30p Informed to wear a mask and stay in vehicle.

## 2018-08-21 LAB — NOVEL CORONAVIRUS, NAA: SARS-CoV-2, NAA: NOT DETECTED

## 2018-09-20 ENCOUNTER — Inpatient Hospital Stay (HOSPITAL_COMMUNITY): Payer: BC Managed Care – PPO | Admitting: Anesthesiology

## 2018-09-20 ENCOUNTER — Encounter (HOSPITAL_COMMUNITY): Payer: Self-pay | Admitting: *Deleted

## 2018-09-20 ENCOUNTER — Inpatient Hospital Stay (HOSPITAL_COMMUNITY)
Admission: AD | Admit: 2018-09-20 | Discharge: 2018-09-23 | DRG: 807 | Disposition: A | Discharge status: Home / Self Care | Payer: BC Managed Care – PPO | Attending: Obstetrics and Gynecology | Admitting: Obstetrics and Gynecology

## 2018-09-20 ENCOUNTER — Other Ambulatory Visit: Payer: Self-pay

## 2018-09-20 DIAGNOSIS — Z1159 Encounter for screening for other viral diseases: Secondary | ICD-10-CM

## 2018-09-20 DIAGNOSIS — O139 Gestational [pregnancy-induced] hypertension without significant proteinuria, unspecified trimester: Secondary | ICD-10-CM | POA: Diagnosis present

## 2018-09-20 DIAGNOSIS — O134 Gestational [pregnancy-induced] hypertension without significant proteinuria, complicating childbirth: Principal | ICD-10-CM | POA: Diagnosis present

## 2018-09-20 DIAGNOSIS — Z3A36 36 weeks gestation of pregnancy: Secondary | ICD-10-CM | POA: Diagnosis not present

## 2018-09-20 DIAGNOSIS — O3663X0 Maternal care for excessive fetal growth, third trimester, not applicable or unspecified: Secondary | ICD-10-CM | POA: Diagnosis present

## 2018-09-20 DIAGNOSIS — F419 Anxiety disorder, unspecified: Secondary | ICD-10-CM | POA: Diagnosis present

## 2018-09-20 DIAGNOSIS — F329 Major depressive disorder, single episode, unspecified: Secondary | ICD-10-CM | POA: Diagnosis present

## 2018-09-20 DIAGNOSIS — O42913 Preterm premature rupture of membranes, unspecified as to length of time between rupture and onset of labor, third trimester: Secondary | ICD-10-CM | POA: Diagnosis present

## 2018-09-20 DIAGNOSIS — O42013 Preterm premature rupture of membranes, onset of labor within 24 hours of rupture, third trimester: Secondary | ICD-10-CM | POA: Diagnosis present

## 2018-09-20 DIAGNOSIS — O99344 Other mental disorders complicating childbirth: Secondary | ICD-10-CM | POA: Diagnosis present

## 2018-09-20 HISTORY — DX: Essential (primary) hypertension: I10

## 2018-09-20 LAB — COMPREHENSIVE METABOLIC PANEL
ALT: 19 U/L (ref 0–44)
ALT: 18 U/L (ref 0–44)
AST: 27 U/L (ref 15–41)
AST: 23 U/L (ref 15–41)
Albumin: 3 g/dL — ABNORMAL LOW (ref 3.5–5.0)
Albumin: 2.9 g/dL — ABNORMAL LOW (ref 3.5–5.0)
Alkaline Phosphatase: 120 U/L (ref 38–126)
Alkaline Phosphatase: 129 U/L — ABNORMAL HIGH (ref 38–126)
Anion gap: 11 (ref 5–15)
Anion gap: 9 (ref 5–15)
BUN: 10 mg/dL (ref 6–20)
BUN: 5 mg/dL — ABNORMAL LOW (ref 6–20)
CO2: 18 mmol/L — ABNORMAL LOW (ref 22–32)
CO2: 21 mmol/L — ABNORMAL LOW (ref 22–32)
Calcium: 9.8 mg/dL (ref 8.9–10.3)
Calcium: 9.1 mg/dL (ref 8.9–10.3)
Chloride: 109 mmol/L (ref 98–111)
Chloride: 108 mmol/L (ref 98–111)
Creatinine, Ser: 0.66 mg/dL (ref 0.44–1.00)
Creatinine, Ser: 0.66 mg/dL (ref 0.44–1.00)
GFR calc Af Amer: >60 mL/min (ref >60)
GFR calc Af Amer: >60 mL/min (ref >60)
GFR calc non Af Amer: >60 mL/min (ref >60)
GFR calc non Af Amer: >60 mL/min (ref >60)
Glucose, Bld: 98 mg/dL (ref 70–99)
Glucose, Bld: 84 mg/dL (ref 70–99)
Potassium: 4.1 mmol/L (ref 3.5–5.1)
Potassium: 3.8 mmol/L (ref 3.5–5.1)
Sodium: 138 mmol/L (ref 135–145)
Sodium: 138 mmol/L (ref 135–145)
Total Bilirubin: 0.6 mg/dL (ref 0.3–1.2)
Total Bilirubin: 0.6 mg/dL (ref 0.3–1.2)
Total Protein: 6 g/dL — ABNORMAL LOW (ref 6.5–8.1)
Total Protein: 5.8 g/dL — ABNORMAL LOW (ref 6.5–8.1)

## 2018-09-20 LAB — CBC
HCT: 35.5 % — ABNORMAL LOW (ref 36.0–46.0)
HCT: 34.5 % — ABNORMAL LOW (ref 36.0–46.0)
Hemoglobin: 12 g/dL (ref 12.0–15.0)
Hemoglobin: 11.5 g/dL — ABNORMAL LOW (ref 12.0–15.0)
MCH: 30.8 pg (ref 26.0–34.0)
MCH: 30.5 pg (ref 26.0–34.0)
MCHC: 33.8 g/dL (ref 30.0–36.0)
MCHC: 33.3 g/dL (ref 30.0–36.0)
MCV: 91.3 fL (ref 80.0–100.0)
MCV: 91.5 fL (ref 80.0–100.0)
Platelets: 203 10*3/uL (ref 150–400)
Platelets: 193 10*3/uL (ref 150–400)
RBC: 3.89 MIL/uL (ref 3.87–5.11)
RBC: 3.77 MIL/uL — ABNORMAL LOW (ref 3.87–5.11)
RDW: 13.7 % (ref 11.5–15.5)
RDW: 13.7 % (ref 11.5–15.5)
WBC: 10.3 10*3/uL (ref 4.0–10.5)
WBC: 11.1 10*3/uL — ABNORMAL HIGH (ref 4.0–10.5)
nRBC: 0 % (ref 0.0–0.2)
nRBC: 0 % (ref 0.0–0.2)

## 2018-09-20 LAB — PROTEIN / CREATININE RATIO, URINE
Creatinine, Urine: 119.88 mg/dL
Protein Creatinine Ratio: 0.23 mg/mg{Cre} — ABNORMAL HIGH (ref 0.00–0.15)
Total Protein, Urine: 27 mg/dL

## 2018-09-20 LAB — URINALYSIS, ROUTINE W REFLEX MICROSCOPIC
Bilirubin Urine: NEGATIVE
Glucose, UA: NEGATIVE mg/dL
Ketones, ur: NEGATIVE mg/dL
Leukocytes,Ua: NEGATIVE
Nitrite: NEGATIVE
Protein, ur: 100 mg/dL — AB
Specific Gravity, Urine: 1.018 (ref 1.005–1.030)
pH: 6 (ref 5.0–8.0)

## 2018-09-20 LAB — URIC ACID: Uric Acid, Serum: 3.8 mg/dL (ref 2.5–7.1)

## 2018-09-20 LAB — TYPE AND SCREEN
ABO/RH(D): AB POS
Antibody Screen: NEGATIVE

## 2018-09-20 LAB — ABO/RH: ABO/RH(D): AB POS

## 2018-09-20 LAB — POCT FERN TEST: POCT Fern Test: POSITIVE

## 2018-09-20 LAB — RPR: RPR Ser Ql: NONREACTIVE

## 2018-09-20 LAB — SARS CORONAVIRUS 2 BY RT PCR (HOSPITAL ORDER, PERFORMED IN ~~LOC~~ HOSPITAL LAB): SARS Coronavirus 2: NEGATIVE

## 2018-09-20 MED ORDER — EPHEDRINE 5 MG/ML INJ: 10.0000 mg | INTRAVENOUS | Status: DC | PRN

## 2018-09-20 MED ORDER — MISOPROSTOL 50MCG HALF TABLET
ORAL_TABLET | ORAL | Status: AC
  Filled 2018-09-20: qty 1

## 2018-09-20 MED ORDER — PHENYLEPHRINE 40 MCG/ML (10ML) SYRINGE FOR IV PUSH (FOR BLOOD PRESSURE SUPPORT): 80.0000 ug | PREFILLED_SYRINGE | INTRAVENOUS | Status: DC | PRN

## 2018-09-20 MED ORDER — OXYCODONE-ACETAMINOPHEN 5-325 MG PO TABS: 2.0000 | ORAL_TABLET | ORAL | Status: DC | PRN

## 2018-09-20 MED ORDER — PENICILLIN G 3 MILLION UNITS IVPB - SIMPLE MED
3.0000 10*6.[IU] | INTRAVENOUS | Status: DC
  Administered 2018-09-20 – 2018-09-21 (×7): 3 10*6.[IU] via INTRAVENOUS
  Filled 2018-09-20 (×8): qty 100

## 2018-09-20 MED ORDER — LACTATED RINGERS IV SOLN
INTRAVENOUS | Status: DC
  Administered 2018-09-20 (×2): via INTRAVENOUS

## 2018-09-20 MED ORDER — SOD CITRATE-CITRIC ACID 500-334 MG/5ML PO SOLN: 30.0000 mL | ORAL | Status: DC | PRN

## 2018-09-20 MED ORDER — MISOPROSTOL 50MCG HALF TABLET
50.0000 ug | ORAL_TABLET | Freq: Once | ORAL | Status: AC
  Administered 2018-09-20: 50 ug via ORAL

## 2018-09-20 MED ORDER — LABETALOL HCL 5 MG/ML IV SOLN: 40.0000 mg | INTRAVENOUS | Status: DC | PRN

## 2018-09-20 MED ORDER — LIDOCAINE HCL (PF) 1 % IJ SOLN
30.0000 mL | INTRAMUSCULAR | Status: AC | PRN
  Administered 2018-09-21: 30 mL via SUBCUTANEOUS
  Filled 2018-09-20: qty 30

## 2018-09-20 MED ORDER — LACTATED RINGERS IV SOLN: 500.0000 mL | Freq: Once | INTRAVENOUS | Status: DC

## 2018-09-20 MED ORDER — SODIUM CHLORIDE 0.9 % IV SOLN
5.0000 10*6.[IU] | Freq: Once | INTRAVENOUS | Status: AC
  Administered 2018-09-20: 5 10*6.[IU] via INTRAVENOUS
  Filled 2018-09-20: qty 5

## 2018-09-20 MED ORDER — ZOLPIDEM TARTRATE 5 MG PO TABS
5.0000 mg | ORAL_TABLET | Freq: Once | ORAL | Status: AC
  Administered 2018-09-20: 5 mg via ORAL
  Filled 2018-09-20: qty 1

## 2018-09-20 MED ORDER — FENTANYL-BUPIVACAINE-NACL 0.5-0.125-0.9 MG/250ML-% EP SOLN
12.0000 mL/h | EPIDURAL | Status: DC | PRN
  Filled 2018-09-20 (×2): qty 250

## 2018-09-20 MED ORDER — OXYTOCIN 40 UNITS IN NORMAL SALINE INFUSION - SIMPLE MED: 2.5000 [IU]/h | INTRAVENOUS | Status: DC

## 2018-09-20 MED ORDER — MISOPROSTOL 50MCG HALF TABLET
50.0000 ug | ORAL_TABLET | Freq: Once | ORAL | Status: AC
  Administered 2018-09-20: 50 ug via ORAL
  Filled 2018-09-20: qty 1

## 2018-09-20 MED ORDER — LACTATED RINGERS IV SOLN: 500.0000 mL | INTRAVENOUS | Status: DC | PRN

## 2018-09-20 MED ORDER — HYDRALAZINE HCL 20 MG/ML IJ SOLN: 10.0000 mg | INTRAMUSCULAR | Status: DC | PRN

## 2018-09-20 MED ORDER — OXYTOCIN BOLUS FROM INFUSION
500.0000 mL | Freq: Once | INTRAVENOUS | Status: AC
  Administered 2018-09-21: 500 mL via INTRAVENOUS

## 2018-09-20 MED ORDER — BUTORPHANOL TARTRATE 1 MG/ML IJ SOLN
1.0000 mg | INTRAMUSCULAR | Status: DC | PRN
  Administered 2018-09-20: 1 mg via INTRAVENOUS
  Filled 2018-09-20: qty 1

## 2018-09-20 MED ORDER — LABETALOL HCL 5 MG/ML IV SOLN
20.0000 mg | INTRAVENOUS | Status: DC | PRN
  Administered 2018-09-20: 20 mg via INTRAVENOUS
  Filled 2018-09-20: qty 4

## 2018-09-20 MED ORDER — OXYTOCIN 40 UNITS IN NORMAL SALINE INFUSION - SIMPLE MED
1.0000 m[IU]/min | INTRAVENOUS | Status: DC
  Administered 2018-09-20: 2 m[IU]/min via INTRAVENOUS
  Filled 2018-09-20: qty 1000

## 2018-09-20 MED ORDER — DIPHENHYDRAMINE HCL 50 MG/ML IJ SOLN: 12.5000 mg | INTRAMUSCULAR | Status: DC | PRN

## 2018-09-20 MED ORDER — ONDANSETRON HCL 4 MG/2ML IJ SOLN
4.0000 mg | Freq: Four times a day (QID) | INTRAMUSCULAR | Status: DC | PRN
  Administered 2018-09-20 – 2018-09-21 (×2): 4 mg via INTRAVENOUS
  Filled 2018-09-20 (×2): qty 2

## 2018-09-20 MED ORDER — OXYCODONE-ACETAMINOPHEN 5-325 MG PO TABS: 1.0000 | ORAL_TABLET | ORAL | Status: DC | PRN

## 2018-09-20 MED ORDER — ACETAMINOPHEN 325 MG PO TABS: 650.0000 mg | ORAL_TABLET | ORAL | Status: DC | PRN

## 2018-09-20 MED ORDER — LABETALOL HCL 5 MG/ML IV SOLN: 80.0000 mg | INTRAVENOUS | Status: DC | PRN

## 2018-09-20 MED ORDER — TERBUTALINE SULFATE 1 MG/ML IJ SOLN: 0.2500 mg | Freq: Once | INTRAMUSCULAR | Status: DC | PRN

## 2018-09-20 MED ORDER — FLEET ENEMA 7-19 GM/118ML RE ENEM: 1.0000 | ENEMA | RECTAL | Status: DC | PRN

## 2018-09-20 NOTE — MAU Note (Signed)
Covid test collected without difficulty and pt tol well. No symptoms.

## 2018-09-20 NOTE — Anesthesia Procedure Notes (Signed)
Epidural Patient location during procedure: OB Start time: 09/20/2018 4:34 PM  Staffing Anesthesiologist: Barnet Glasgow, MD Performed: anesthesiologist   Preanesthetic Checklist Completed: patient identified, site marked, surgical consent, pre-op evaluation, timeout performed, IV checked, risks and benefits discussed and monitors and equipment checked  Epidural Patient position: sitting Prep: site prepped and draped and DuraPrep Patient monitoring: continuous pulse ox and blood pressure Approach: midline Location: L3-L4 Injection technique: LOR air  Needle:  Needle type: Tuohy  Needle gauge: 17 G Needle length: 9 cm and 9 Needle insertion depth: 5 cm cm Catheter type: closed end flexible Catheter size: 19 Gauge Catheter at skin depth: 10 cm Test dose: negative  Assessment Events: blood not aspirated, injection not painful, no injection resistance, negative IV test and no paresthesia  Additional Notes Patient identified. Risks/Benefits/Options discussed with patient including but not limited to bleeding, infection, nerve damage, paralysis, failed block, incomplete pain control, headache, blood pressure changes, nausea, vomiting, reactions to medication both or allergic, itching and postpartum back pain. Confirmed with bedside nurse the patient's most recent platelet count. Confirmed with patient that they are not currently taking any anticoagulation, have any bleeding history or any family history of bleeding disorders. Patient expressed understanding and wished to proceed. All questions were answered. Sterile technique was used throughout the entire procedure. Please see nursing notes for vital signs. Test dose was given through epidural needle and negative prior to continuing to dose epidural or start infusion. Warning signs of high block given to the patient including shortness of breath, tingling/numbness in hands, complete motor block, or any concerning symptoms with  instructions to call for help. Patient was given instructions on fall risk and not to get out of bed. All questions and concerns addressed with instructions to call with any issues. 1 Attempt (S) . Patient tolerated procedure well.

## 2018-09-20 NOTE — Progress Notes (Signed)
Daje Stark is a 28 y.o. G1P0 at [redacted]w[redacted]d by LMP admitted for induction of labor due to Spontaneous rupture of BOW and Hypertension.  Subjective:   Objective: BP 135/65   Pulse 82   Temp 98.1 F (36.7 C) (Oral)   Resp 16   Ht 5\' 9"  (1.753 m)   Wt 117 kg   LMP 01/10/2018   BMI 38.10 kg/m  No intake/output data recorded. No intake/output data recorded.  FHT:  FHR: 145 bpm, variability: moderate,  accelerations:  Present,  decelerations:  Absent UC:   regular, every 3 minutes SVE:   5-6/90/-1 IUPC placed without difficulty  Labs: Lab Results  Component Value Date   WBC 11.1 (H) 09/20/2018   HGB 11.5 (L) 09/20/2018   HCT 34.5 (L) 09/20/2018   MCV 91.5 09/20/2018   PLT 193 09/20/2018   CMP     Component Value Date/Time   NA 138 09/20/2018 1555   K 3.8 09/20/2018 1555   CL 108 09/20/2018 1555   CO2 21 (L) 09/20/2018 1555   GLUCOSE 84 09/20/2018 1555   BUN 5 (L) 09/20/2018 1555   CREATININE 0.66 09/20/2018 1555   CALCIUM 9.1 09/20/2018 1555   PROT 5.8 (L) 09/20/2018 1555   ALBUMIN 2.9 (L) 09/20/2018 1555   AST 23 09/20/2018 1555   ALT 18 09/20/2018 1555   ALKPHOS 129 (H) 09/20/2018 1555   BILITOT 0.6 09/20/2018 1555   GFRNONAA >60 09/20/2018 1555   GFRAA >60 09/20/2018 1555    Assessment / Plan: Augmentation of labor, progressing well  Gest HTN stable, labs stable PROM- no s/s chorio  Labor: Progressing normally Preeclampsia:  no signs or symptoms of toxicity, intake and ouput balanced and labs stable Fetal Wellbeing:  Category I Pain Control:  Epidural I/D:  n/a Anticipated MOD:  NSVD  Ruchel Brandenburger J 09/20/2018, 6:47 PM

## 2018-09-20 NOTE — Progress Notes (Signed)
Dr Benjie Karvonen notified of pt's admission and status. Aware of SROM at 2315 -slight yellow tint, elevated B/Ps. Will admit to [facility] and orders received.

## 2018-09-20 NOTE — Anesthesia Preprocedure Evaluation (Signed)
Anesthesia Evaluation  Patient identified by MRN, date of birth, ID band Patient awake    Reviewed: Allergy & Precautions, NPO status , Patient's Chart, lab work & pertinent test results  Airway Mallampati: II  TM Distance: >3 FB Neck ROM: Full    Dental no notable dental hx. (+) Teeth Intact   Pulmonary    Pulmonary exam normal breath sounds clear to auscultation       Cardiovascular hypertension, Pt. on medications Normal cardiovascular exam Rhythm:Regular Rate:Normal     Neuro/Psych  Headaches, PSYCHIATRIC DISORDERS Depression    GI/Hepatic negative GI ROS, Neg liver ROS,   Endo/Other  negative endocrine ROS  Renal/GU negative Renal ROS     Musculoskeletal   Abdominal   Peds  Hematology   Anesthesia Other Findings   Reproductive/Obstetrics (+) Pregnancy                             Lab Results  Component Value Date   WBC 11.1 (H) 09/20/2018   HGB 11.5 (L) 09/20/2018   HCT 34.5 (L) 09/20/2018   MCV 91.5 09/20/2018   PLT 193 09/20/2018    Anesthesia Physical Anesthesia Plan  ASA: III  Anesthesia Plan: Epidural   Post-op Pain Management:    Induction:   PONV Risk Score and Plan:   Airway Management Planned:   Additional Equipment:   Intra-op Plan:   Post-operative Plan:   Informed Consent: I have reviewed the patients History and Physical, chart, labs and discussed the procedure including the risks, benefits and alternatives for the proposed anesthesia with the patient or authorized representative who has indicated his/her understanding and acceptance.     Dental advisory given  Plan Discussed with:   Anesthesia Plan Comments:         Anesthesia Quick Evaluation

## 2018-09-20 NOTE — Progress Notes (Signed)
Vtx confirmed by bedside US 

## 2018-09-20 NOTE — Progress Notes (Signed)
Patient requested epidural. Awaiting labs to be drawn at this time.

## 2018-09-20 NOTE — MAU Note (Signed)
Felt pop about 2315 and leaked some fld. Have continued to leak fld and have seen alittle blood in the fld. No pain. Some of the fld has looked like may have yellow tint on floor but clear on toilet paper.

## 2018-09-20 NOTE — MAU Note (Signed)
Lab called unit to let RN know that catheter urine had spilled in the bag and would need to be recollected.

## 2018-09-20 NOTE — H&P (Addendum)
Joann Hebert is a 28 y.o. female presenting for SROM at 36 wks. Also elevated BP in MAU  G2P0.PNCare from 7 wks with [facility]. Anxiety, Depression Hx, used Lexapro/ Wellbutrin in the past, started Zoloft at 30 wks in pregnancy.  S>D, maternal BMI 31 pre-preg. Passed Glucola. Growth sono 34 wks - LGA at 6'15" 96%, AC 99%, BPD 98%.  elevated BP at home 150/90s, Office <140/90 at last visit last week with increased swelling  OB History    Gravida  1   Para      Term      Preterm      AB      Living        SAB      TAB      Ectopic      Multiple      Live Births             Past Medical History:  Diagnosis Date  . Anxiety   . Depression   . Hypertension   . Migraines    has tried imitrex   Past Surgical History:  Procedure Laterality Date  . WISDOM TOOTH EXTRACTION Bilateral 2009   Family History: family history includes Alcohol abuse in her maternal grandmother; Depression in her brother, father, and paternal grandfather; Diabetes in her father, maternal grandfather, and maternal grandmother; Heart attack in her father and paternal grandfather; Hyperlipidemia in her father, maternal grandfather, and maternal grandmother; Hypertension in her father, maternal grandfather, and maternal grandmother; Kidney disease in her maternal grandfather; Miscarriages / [place] in her maternal grandmother; Osteoarthritis in her maternal grandmother. Social History:  reports that she has never smoked. She has never used smokeless tobacco. She reports current alcohol use. She reports that she does not use drugs.     Maternal Diabetes: No Genetic Screening: Normal Ultrascreen and AFP1 Maternal Ultrasounds/Referrals: Normal anatomy. LGA baby at 34 wks Fetal Ultrasounds or other Referrals:  None Maternal Substance Abuse:  No Significant Maternal Medications:  Meds include: Other: Zoloft 50mg  from 30 wks  Significant Maternal Lab Results:  None Other Comments:   None  ROS History Dilation: 1 Effacement (%): 30 Station: -3 Exam by:: [employer] RN Blood pressure (!) 138/94, pulse 83, temperature 99.1 F (37.3 C), temperature source Axillary, resp. rate 18, height 5\' 9"  (1.753 m), weight 117 kg, last menstrual period 01/10/2018. Exam Physical Exam  Physical exam:  A&O x 3, no acute distress. Pleasant HEENT neg, no thyromegaly Lungs CTA bilat CV RRR, S1S2 normal Abdo soft, non tender, non acute. Per MAU bedside sono Cephalic Extr no edema/ tenderness Pelvic per RN 1/30%/-3  FHT 150s + accels no decels mod variability - cat I Toco intermittent   Prenatal labs: ABO, Rh: --/--/AB POS, AB POS Performed at [facility], [address] [place], [place] [address]  [id number](07/10 0118) Antibody: NEG (07/10 0118) Rubella:  Immune RPR:   Neg HBsAg:   Neg HIV: NON-REACTIVE (10/09 0824)  GBS:   not done   Assessment/Plan: 27  Yo G2P0. 36 wks. Admitting with SROM Cytotec PO 50mcg 1-2 doses and reassess when ready for pitocin. Light labor diet until active labor or pitocin Unkn GBS- treat with PCN per protocol due to prematurity Gestational HTN without PEC with nl Labs and nl P/C ratio. S/p 1 dose of IV Labetalol in MAU for severe range BP without symptoms. Closely monitor LGA- EFW 7.1/2lbs Anxiety/Depression Hx- Zoloft 50mg , continue PP     [name]  [name] 09/20/2018, 7:13 AM

## 2018-09-20 NOTE — Progress Notes (Signed)
To BS via w/c °

## 2018-09-20 NOTE — Progress Notes (Signed)
Joann Hebert is a 28 y.o. G1P0 at [redacted]w[redacted]d by ultrasound admitted for rupture of membranes, clear fluid at 11.15 pm last night  Subjective: No complaints. No HA/ RUQ -epig pain/ swelling/ scotomas/ CP/SOB. No vb. Clear vag fluid. +FMs.   Objective: BP 130/72   Pulse 80   Temp 97.9 F (36.6 C) (Oral)   Resp 16   Ht 5\' 9"  (1.753 m)   Wt 117 kg   LMP 01/10/2018   BMI 38.10 kg/m   FHR: 140s  bpm, variability: moderate,  accelerations:  Present,  decelerations:  Absent UC:   irregular, every 2-4 minutes SVE:   Dilation: 1 Effacement (%): 70 Station: -2 Exam by:: Dr. Benjie Karvonen Cervical Foley placed, tolerated well.   Labs: Lab Results  Component Value Date   WBC 10.3 09/20/2018   HGB 12.0 09/20/2018   HCT 35.5 (L) 09/20/2018   MCV 91.3 09/20/2018   PLT 203 09/20/2018    Assessment / Plan: 28 yo G2P0, at 36.1 wks, SROM on 09/19/18 at 11.15 pm. Admitted for delivery with IOL.  S/p PO cytotec x 2 doses. 2nd at 6.45 am. S/p Cervical foley. Start Pitocin at 10.45 am.  - Gestational HTN with one dose of IV Labetalol 20mg  in MAU, BP stable since. Normal urine P/c and LFTs, platelets and no neural s/s, so preeclampsia - GBS not done, treating with PCN for prematurity - LGA per last sono, now EFW 8-8.1/2 lbs. Reviewed Shoulder dystocia risk and precaution in labor, maneuvers if needed - Pt prefers to avoid forceps/ vacuum if possible] - DepressionHx- Zoloft 50mg  daily, continue PP   MOD- working towards vaginal birth, pelvic adequate. Pt advised of on-call MD switch at 1 pm.   Elveria Royals 09/20/2018, 10:21 AM

## 2018-09-20 NOTE — Progress Notes (Signed)
Dr. Benjie Karvonen performed cervical check.  Gave order to start Pitocin per order at 1039 without cervical exam at that time.

## 2018-09-21 ENCOUNTER — Encounter (HOSPITAL_COMMUNITY): Payer: Self-pay

## 2018-09-21 DIAGNOSIS — O139 Gestational [pregnancy-induced] hypertension without significant proteinuria, unspecified trimester: Secondary | ICD-10-CM | POA: Diagnosis present

## 2018-09-21 DIAGNOSIS — O42013 Preterm premature rupture of membranes, onset of labor within 24 hours of rupture, third trimester: Secondary | ICD-10-CM | POA: Diagnosis present

## 2018-09-21 LAB — OB RESULTS CONSOLE ABO/RH: RH Type: POSITIVE

## 2018-09-21 MED ORDER — ONDANSETRON HCL 4 MG PO TABS: 4.0000 mg | ORAL_TABLET | ORAL | Status: DC | PRN

## 2018-09-21 MED ORDER — DIBUCAINE (PERIANAL) 1 % EX OINT: 1.0000 "application " | TOPICAL_OINTMENT | CUTANEOUS | Status: DC | PRN

## 2018-09-21 MED ORDER — SIMETHICONE 80 MG PO CHEW: 80.0000 mg | CHEWABLE_TABLET | ORAL | Status: DC | PRN

## 2018-09-21 MED ORDER — COCONUT OIL OIL: 1.0000 "application " | TOPICAL_OIL | Status: DC | PRN

## 2018-09-21 MED ORDER — DIPHENHYDRAMINE HCL 25 MG PO CAPS: 25.0000 mg | ORAL_CAPSULE | Freq: Four times a day (QID) | ORAL | Status: DC | PRN

## 2018-09-21 MED ORDER — WITCH HAZEL-GLYCERIN EX PADS: 1.0000 "application " | MEDICATED_PAD | CUTANEOUS | Status: DC | PRN

## 2018-09-21 MED ORDER — METHYLERGONOVINE MALEATE 0.2 MG PO TABS: 0.2000 mg | ORAL_TABLET | ORAL | Status: DC | PRN

## 2018-09-21 MED ORDER — ONDANSETRON HCL 4 MG/2ML IJ SOLN: 4.0000 mg | INTRAMUSCULAR | Status: DC | PRN

## 2018-09-21 MED ORDER — TRANEXAMIC ACID-NACL 1000-0.7 MG/100ML-% IV SOLN
INTRAVENOUS | Status: AC
  Administered 2018-09-21: 1000 mg via INTRAVENOUS
  Filled 2018-09-21: qty 100

## 2018-09-21 MED ORDER — PRENATAL MULTIVITAMIN CH
1.0000 | ORAL_TABLET | Freq: Every day | ORAL | Status: DC
  Administered 2018-09-21 – 2018-09-22 (×2): 1 via ORAL
  Filled 2018-09-21 (×2): qty 1

## 2018-09-21 MED ORDER — OXYCODONE-ACETAMINOPHEN 5-325 MG PO TABS
1.0000 | ORAL_TABLET | ORAL | Status: DC | PRN
  Administered 2018-09-21: 1 via ORAL
  Filled 2018-09-21: qty 1

## 2018-09-21 MED ORDER — ACETAMINOPHEN 325 MG PO TABS
650.0000 mg | ORAL_TABLET | ORAL | Status: DC | PRN
  Administered 2018-09-21 – 2018-09-23 (×4): 650 mg via ORAL
  Filled 2018-09-21 (×4): qty 2

## 2018-09-21 MED ORDER — ZOLPIDEM TARTRATE 5 MG PO TABS: 5.0000 mg | ORAL_TABLET | Freq: Every evening | ORAL | Status: DC | PRN

## 2018-09-21 MED ORDER — SENNOSIDES-DOCUSATE SODIUM 8.6-50 MG PO TABS
2.0000 | ORAL_TABLET | ORAL | Status: DC
  Administered 2018-09-21 – 2018-09-22 (×2): 2 via ORAL
  Filled 2018-09-21 (×2): qty 2

## 2018-09-21 MED ORDER — OXYCODONE-ACETAMINOPHEN 5-325 MG PO TABS: 2.0000 | ORAL_TABLET | ORAL | Status: DC | PRN

## 2018-09-21 MED ORDER — IBUPROFEN 600 MG PO TABS
600.0000 mg | ORAL_TABLET | Freq: Four times a day (QID) | ORAL | Status: DC
  Administered 2018-09-21 – 2018-09-23 (×9): 600 mg via ORAL
  Filled 2018-09-21 (×9): qty 1

## 2018-09-21 MED ORDER — SERTRALINE HCL 50 MG PO TABS
50.0000 mg | ORAL_TABLET | Freq: Every day | ORAL | Status: DC
  Administered 2018-09-21 – 2018-09-23 (×3): 50 mg via ORAL
  Filled 2018-09-21 (×3): qty 1

## 2018-09-21 MED ORDER — METHYLERGONOVINE MALEATE 0.2 MG/ML IJ SOLN: 0.2000 mg | INTRAMUSCULAR | Status: DC | PRN

## 2018-09-21 MED ORDER — TETANUS-DIPHTH-ACELL PERTUSSIS 5-2.5-18.5 LF-MCG/0.5 IM SUSP: 0.5000 mL | Freq: Once | INTRAMUSCULAR | Status: DC

## 2018-09-21 MED ORDER — BENZOCAINE-MENTHOL 20-0.5 % EX AERO
1.0000 "application " | INHALATION_SPRAY | CUTANEOUS | Status: DC | PRN
  Administered 2018-09-21: 1 via TOPICAL
  Filled 2018-09-21: qty 56

## 2018-09-21 MED ORDER — TRANEXAMIC ACID-NACL 1000-0.7 MG/100ML-% IV SOLN
1000.0000 mg | INTRAVENOUS | Status: AC
  Administered 2018-09-21: 10:00:00 1000 mg via INTRAVENOUS

## 2018-09-21 NOTE — Progress Notes (Signed)
Joann Hebert is a 28 y.o. G1P0 at [redacted]w[redacted]d by LMP admitted for induction of labor due to Hypertension and SROM.  Subjective: comfortable  Objective: BP 96/71   Pulse 75   Temp 98.6 F (37 C) (Oral)   Resp 16   Ht 5\' 9"  (1.753 m)   Wt 117 kg   LMP 01/10/2018   BMI 38.10 kg/m  I/O last 3 completed shifts: In: -  Out: 2200 [Urine:2200] No intake/output data recorded.  FHT:  FHR: 125 bpm, variability: moderate,  accelerations:  Present,  decelerations:  Absent UC:   regular, every 2-3 minutes SVE:   Dilation: 8.5 Effacement (%): 100 Station: -1, 0 Exam by:: [employer] RN  Labs: Lab Results  Component Value Date   WBC 11.1 (H) 09/20/2018   HGB 11.5 (L) 09/20/2018   HCT 34.5 (L) 09/20/2018   MCV 91.5 09/20/2018   PLT 193 09/20/2018    Assessment / Plan: Induction of labor due to gestational hypertension,  progressing well on pitocin  Labor: Progressing normally Preeclampsia:  no signs or symptoms of toxicity, intake and ouput balanced and labs stable Fetal Wellbeing:  Category I Pain Control:  Epidural I/D:  n/a Anticipated MOD:  NSVD  Kris No J 09/21/2018, 7:17 AM

## 2018-09-21 NOTE — Lactation Note (Signed)
This note was copied from a baby's chart. Lactation Consultation Note  Patient Name: Joann Hebert [id number] Date: 09/21/2018 Reason for consult: Follow-up assessment;Late-preterm 34-36.6wks;1st time breastfeeding;Primapara  Visited with mom twice, the first time was for her initial assessment and the second one to help latching baby to the breast. 5 hours old LPI female who is being partially BF and formula feeding by her mother, she's a P1. She reported (+) breast changes during the pregnancy. Baby has started supplementation already with Similac 20 calorie formula, she's at 8 pounds so Similac 22 was not needed in this case.  Mom has a Hx of marijuana consumption (+) during the pregnancy but the UDS was negative. She has a Motif DEBP at home and a Spectra 1 coming in the mail.  LC assisted with hand expression and noticed that mom has flat nipples with some pitting edema, set her up with shells, instructions, cleaning and storage were also reviewed. Very little colostrum noted at this point, but mom is now hopeful that "something" is coming out. Her RN Darleene Cleaver has been very proactive and already got her pumping with a DEBP, mom pumped once but was discouraged because "nothing came out". Explained to parents that the purpose of pumping early on was mainly for breast stimulation and not to get volume.   LC came back later to assist with the latch, before her glucose are drawn but she was too sleepy, LC tried the cross cradle hold, and sandwich the breast but baby wouldn't latch, she just did a few sucks and then went back to sleep. RN Deven fed baby 8 ml of Similac formula with a curve tip syringe. Reviewed normal newborn behavior, feeding cues, cluster feeding and supplementation guidelines for LPI and clarify to parents that babies need to be fed 8-12 times/24 hours, at least every 3 hours, they got confused because of the wording used in the [facility] brochure and the recommendations given according to [facility].   Feeding plan  1. Encouraged mom to feed baby STS every 3 hours or sooner if feeding cues are present 2. She'll pump every 3 hours after feedings 3. Parent will continue supplementing baby with Similac 20 calorie formula after every feedings according to LPI guidelines  BF brochure, BF resources and feeding diary were reviewed. Parents reported all questions and concerns were answered, they're both aware of [facility] OP services and will call PRN.  Maternal Data Formula Feeding for Exclusion: No Has patient been taught Hand Expression?: Yes Does the patient have breastfeeding experience prior to this delivery?: No  Feeding Feeding Type: Formula   Interventions Interventions: Breast feeding basics reviewed;Assisted with latch;Skin to skin;Breast massage;Hand express;Breast compression;Support pillows;Shells  Lactation Tools Discussed/Used Tools: Shells Shell Type: Inverted Breast pump type: Double-Electric Breast Pump WIC Program: No Pump Review: Setup, frequency, and cleaning Initiated by:: RN Date initiated:: 09/21/18   Consult Status Consult Status: Follow-up Date: 09/22/18 Follow-up type: In-patient    Joann Hebert Joann Hebert 09/21/2018, 2:47 PM

## 2018-09-21 NOTE — Progress Notes (Signed)
Joann Hebert is a 28 y.o. G1P0 at [redacted]w[redacted]d by LMP admitted for induction of labor due to Spontaneous rupture of BOW and Hypertension.  Subjective: comfortable  Objective: BP 96/71   Pulse 75   Temp 98.6 F (37 C) (Oral)   Resp 16   Ht 5\' 9"  (1.753 m)   Wt 117 kg   LMP 01/10/2018   BMI 38.10 kg/m  I/O last 3 completed shifts: In: -  Out: 2200 [Urine:2200] No intake/output data recorded.  FHT:  FHR: 145 bpm, variability: moderate,  accelerations:  Present,  decelerations:  Absent UC:   regular, every 3 minutes SVE:   5-6/90/-1 Irregular coupling of contractions on IUPC  Labs: Lab Results  Component Value Date   WBC 11.1 (H) 09/20/2018   HGB 11.5 (L) 09/20/2018   HCT 34.5 (L) 09/20/2018   MCV 91.5 09/20/2018   PLT 193 09/20/2018   CMP     Component Value Date/Time   NA 138 09/20/2018 1555   K 3.8 09/20/2018 1555   CL 108 09/20/2018 1555   CO2 21 (L) 09/20/2018 1555   GLUCOSE 84 09/20/2018 1555   BUN 5 (L) 09/20/2018 1555   CREATININE 0.66 09/20/2018 1555   CALCIUM 9.1 09/20/2018 1555   PROT 5.8 (L) 09/20/2018 1555   ALBUMIN 2.9 (L) 09/20/2018 1555   AST 23 09/20/2018 1555   ALT 18 09/20/2018 1555   ALKPHOS 129 (H) 09/20/2018 1555   BILITOT 0.6 09/20/2018 1555   GFRNONAA >60 09/20/2018 1555   GFRAA >60 09/20/2018 1555    Assessment / Plan: Augmentation of labor, progressing well -dysfunctional pattern Gest HTN stable, labs stable PROM- no s/s chorio  Labor: DC pitocin x 2 hrs and restart Preeclampsia:  no signs or symptoms of toxicity, intake and ouput balanced and labs stable Fetal Wellbeing:  Category I Pain Control:  Epidural I/D:  n/a Anticipated MOD:  NSVD  Joann Hebert 09/21/2018, 7:15 AM

## 2018-09-21 NOTE — Anesthesia Postprocedure Evaluation (Signed)
Anesthesia Post Note  Patient: Joann Hebert  Procedure(s) Performed: AN AD [place]     Patient location during evaluation: Mother Baby Anesthesia Type: Epidural Level of consciousness: awake and alert and oriented Pain management: satisfactory to patient Vital Signs Assessment: post-procedure vital signs reviewed and stable Respiratory status: respiratory function stable Cardiovascular status: stable Postop Assessment: no headache, no backache, epidural receding, patient able to bend at knees, no signs of nausea or vomiting and adequate PO intake Anesthetic complications: no    Last Vitals:  Vitals:   09/21/18 1113 09/21/18 1233  BP: 140/89 (!) 120/59  Pulse: 95 81  Resp:  20  Temp: 37.3 C 37.2 C  SpO2: 99% 98%    Last Pain:  Vitals:   09/21/18 1233  TempSrc: Oral  PainSc:    Pain Goal:                   [name]

## 2018-09-21 NOTE — Progress Notes (Signed)
MOB was referred for history of depression/anxiety. * Referral screened out by Clinical Social Worker because none of the following criteria appear to apply: ~ History of anxiety/depression during this pregnancy, or of post-partum depression following prior delivery. ~ Diagnosis of anxiety and/or depression within last 3 years OR * MOB's symptoms currently being treated with medication and/or therapy. Per OB records review, MOB has an active prescription/ taking Zoloft. The records also note that MOB started therapy.  Please contact the Clinical Social Worker if needs arise, by MOB request, or if MOB scores greater than 9/yes to question 10 on Edinburgh Postpartum Depression Screen.  Allante Whitmire, LCSW Clinical Social Worker [facility] Cell#: [phone]  

## 2018-09-22 LAB — CBC
HCT: 28.7 % — ABNORMAL LOW (ref 36.0–46.0)
Hemoglobin: 9.4 g/dL — ABNORMAL LOW (ref 12.0–15.0)
MCH: 31.3 pg (ref 26.0–34.0)
MCHC: 32.8 g/dL (ref 30.0–36.0)
MCV: 95.7 fL (ref 80.0–100.0)
Platelets: 163 10*3/uL (ref 150–400)
RBC: 3 MIL/uL — ABNORMAL LOW (ref 3.87–5.11)
RDW: 14.1 % (ref 11.5–15.5)
WBC: 9.8 10*3/uL (ref 4.0–10.5)
nRBC: 0 % (ref 0.0–0.2)

## 2018-09-22 NOTE — Progress Notes (Addendum)
Post Partum Day 1 Subjective: no complaints, up ad lib, voiding, tolerating PO and + flatus  Objective: Blood pressure 127/72, pulse 68, temperature 97.6 F (36.4 C), temperature source Oral, resp. rate 15, height 5\' 9"  (1.753 m), weight 117 kg, last menstrual period 01/10/2018, SpO2 100 %, unknown if currently breastfeeding.  Physical Exam:  General: alert, cooperative and appears stated age Lochia: appropriate Uterine Fundus: firm Incision: healing well, no significant drainage DVT Evaluation: No evidence of DVT seen on physical exam. No cords or calf tenderness.  Recent Labs    09/20/18 1555 09/22/18 0411  HGB 11.5* 9.4*  HCT 34.5* 28.7*    Assessment/Plan: Stable PPD 1 Gest HTN- stable - no meds Remain in house per peds due to ROM > 48hrs Plan for discharge tomorrow, Breastfeeding and Lactation consult   LOS: 2 days   Joann Hebert 09/22/2018, 12:17 PM

## 2018-09-22 NOTE — Progress Notes (Signed)
CSW received consult due to score 10 on Edinburgh Depression Screen.    CSW met with MOB at bedside to discuss consult for edinburgh score 10, FOB present. CSW asked FOB to leave during assessment, FOB left voluntarily. CSW introduced self and explained reason for consult. MOB was welcoming and engaged during assessment. CSW and MOB discussed MOB's edinburgh score 10. MOB reported that she has been crying happy and sad tears over the past few days. MOB reported that she cries when she gets emotional. CSW acknowledged and validated MOB's emotions. CSW inquired about MOB's mental health history, MOB reported that she was diagnosed with anxiety and depression in 2013. MOB reported that she is currently taking Zoloft and seeing a therapist. MOB reported that she sees Dr. Easton at [facility]. MOB reported that both medication and therapy are effective in treating her anxiety and depression. CSW inquired about how MOB was currently feeling, MOB reported that she was feeling good. CSW inquired about MOB's support system, MOB reported that her family and friends were her supports. MOB reported that her mother was staying at their home for 2-3 weeks after discharge. MOB presented calm and had great insight on her mental health history/treatment. MOB did not demonstrate any acute mental health signs/symptoms. CSW assessed for safety, MOB denied SI, HI and domestic violence. CSW informed MOB that due to her mental health history she may be more susceptible to postpartum depression.   CSW provided education regarding Baby Blues vs PMADs and provided MOB with resources for mental health follow up.  CSW encouraged MOB to evaluate her mental health throughout the postpartum period with the use of the New Mom Checklist developed by [employer] as well as the Edinburgh Postnatal Depression Scale and notify a medical professional if symptoms arise. MOB appeared receptive to the information  provided.   Joann Shartzer, LCSW Clinical Social Worker [facility] Cell#: [phone]           

## 2018-09-23 MED ORDER — IBUPROFEN 600 MG PO TABS: 600.0000 mg | ORAL_TABLET | Freq: Three times a day (TID) | ORAL | 0 refills | Status: DC | PRN

## 2018-09-23 NOTE — Lactation Note (Signed)
This note was copied from a baby's chart. Lactation Consultation Note  Patient Name: Joann Hebert Date: 09/23/2018  P1, 79 hour female, ETI infant. LC entered the room mom and infant asleep.   Maternal Data    Feeding Feeding Type: Breast Milk with Formula added Nipple Type: Slow - flow  LATCH Score                   Interventions    Lactation Tools Discussed/Used     Consult Status      Joann Hebert 09/23/2018, 2:10 AM

## 2018-09-23 NOTE — Progress Notes (Signed)
PPD2 SVD:   S:  Pt reports feeling  well/ Tolerating po/ Voiding without problems/ No n/v/ Bleeding is moderate/ Pain controlled withprescription NSAID's including motrin  Newborn info BRF live infant   O:  A & O x 3 well/ VS: Blood pressure 129/89, pulse 85, temperature 98.9 F (37.2 C), temperature source Oral, resp. rate 17, height 5\' 9"  (1.753 m), weight 117 kg, last menstrual period 01/10/2018, SpO2 100 %, unknown if currently breastfeeding.  LABS: No results found for this or any previous visit (from the past 24 hour(s)).  I&O: No intake/output data recorded.   No intake/output data recorded.  Lungs: chest clear, no wheezing, rales, normal symmetric air entry, Heart exam - S1, S2 normal, no murmur, no gallop, rate regular  Heart: regular rate and rhythm, S1, S2 normal, no murmur, click, rub or gallop  Abdomen: uterus firm at umb nontender  Perineum: healing with good reapproximation  Lochia: mod  Extremities:no redness or tenderness in the calves or thighs, no edema    A/P: PPD # 2/ [id number]  Doing well  Continue routine post partum orders  D/c home D/c instructions reviewed

## 2018-09-23 NOTE — Discharge Summary (Signed)
Obstetric Discharge Summary Reason for Admission: SROM, gestational HTN Prenatal Procedures: ultrasound Intrapartum Procedures: spontaneous vaginal delivery, 2nd degree perineal laceration repair Postpartum Procedures: none Complications-Operative and Postpartum: none Hemoglobin  Date Value Ref Range Status  09/22/2018 9.4 (L) 12.0 - 15.0 g/dL Final   HCT  Date Value Ref Range Status  09/22/2018 28.7 (L) 36.0 - 46.0 % Final    Physical Exam:  General: alert, cooperative and no distress Lochia: appropriate Uterine Fundus: firm Incision: n/a DVT Evaluation: No evidence of DVT seen on physical exam.  Discharge Diagnoses: Term Pregnancy-delivered  Discharge Information: Date: 09/23/2018 Activity: pelvic rest Diet: routine Medications: PNV Condition: stable Instructions: refer to practice specific booklet Discharge to: home Follow-up Information    Charyl Bigger, MD Follow up in 6 week(s).   Specialty: Obstetrics and Gynecology Contact information: [place]  [id number] [phone]           Newborn Data: Live born female  Birth Weight: 8 lb 0.9 oz (3654 g) APGAR: 58, 9  Newborn Delivery   Birth date/time: 09/21/2018 09:43:00 Delivery type: Vaginal, Spontaneous      Home with mother.  Keith Felten A Westlynn Fifer 09/23/2018, 10:37 AM

## 2018-09-23 NOTE — Lactation Note (Signed)
This note was copied from a baby's chart. Lactation Consultation Note  Patient Name: Joann Hebert ENIDP'O Date: 09/23/2018 Reason for consult: Mother's request;Late-preterm 34-36.6wks P1. 23 hour female LPTI, weight loss -6%, Mom with hx of HTN Per mom, infant has not been latching to breast well, infant has not been sustain latch. Infant has high billi level. Per dad, infant is being supplemented with 30 ml of Similac with iron 20 kcal. LC reviewed hand expression and mom  With hand expression and using the cylinder hand pump expressed 6 ml of colostrum. LC notice mom is short shafted and infant been using bottle nipples. LC fitted mom with 24 mm NS, and prefilled with 1 ml of colostrum in nipple shield with curve tip syringe, mom latched infant on left breast, nose and chin touching breast and infant suckled in rthymitic pattern and sustained latch for 19 minutes.  Infant was given the other 5 ml of colostrum at breast the curve tip syringe was placed at side of nipple. [place] notice infant is transferring breast milk well, NS was full of colostrum. Parents plan to supplement with 24 ml of formula after breastfeeding infant. Mom will continue to work on latching infant to breast. Mom knows to call Nurse or [place] if she has any further questions, concerns or need assistance with latching infant to breast. Mom's plan: 1. Mom will breastfeed according LPTI policy, 8 to 12 times within 24 hours and on demand. 2. Mom will  Pre-pump or do breast stimulation prior to applying  24 mm NS and latching infant to breast. 3. Mom will give infant any EBM from hand expression or using DEBP after breastfeeding before supplementing with formula according LPTI policy.   Maternal Data    Feeding Feeding Type: Breast Fed Nipple Type: Slow - flow  LATCH Score Latch: Grasps breast easily, tongue down, lips flanged, rhythmical sucking.  Audible Swallowing: A few with stimulation  Type of Nipple:  Inverted  Comfort (Breast/Nipple): Soft / non-tender  Hold (Positioning): Assistance needed to correctly position infant at breast and maintain latch.  LATCH Score: 6  Interventions Interventions: Breast feeding basics reviewed;Assisted with latch;Skin to skin;Support pillows;Adjust position;Breast compression;Expressed milk;Position options;Hand express  Lactation Tools Discussed/Used Tools: Shells;Nipple Shields Nipple shield size: 24 Shell Type: Inverted Breast pump type: Double-Electric Breast Pump Pump Review: Setup, frequency, and cleaning Initiated by:: by Nurse   Consult Status Consult Status: Follow-up Date: 09/23/18 Follow-up type: In-patient    Vicente Serene 09/23/2018, 6:23 AM

## 2018-09-23 NOTE — Lactation Note (Signed)
This note was copied from a baby's chart. Lactation Consultation Note  Patient Name: Joann Hebert OZHYQ'M Date: 09/23/2018 Reason for consult: Mother's request;Late-preterm 34-36.6wks   Infant was cueing to eat. Nipple shield was used & prefilled with a small amount of formula. Infant swallowed that and soon fell asleep. Dad is using cylinder hand pump to pump Mom's breasts (size 27 flanges are appropriate). I will return after the other breast is pumped to show them supplementing at breast without using the curved-tip syringe.    Matthias Hughs [facility] 09/23/2018, 9:47 AM

## 2018-09-23 NOTE — Lactation Note (Addendum)
This note was copied from a baby's chart. Lactation Consultation Note  Patient Name: Joann Hebert YMEBR'A Date: 09/23/2018   SNS (5 Fr/syringe) was used at breast, but infant was unable to stay engaged. Feeding was finished with bottle. Slow-flow nipple was used (yellow Similac). Initially, infant seemed fine with flow, but more into the feeding, infant seemed to find it fast. Enfamil extra-slow flow nipples provided & I suggested using "newborn"-level nipples at home. Parents had been using regular flow nipples while here in the hospital.   When infant cried, decreased elevation of tongue noted with possible labial frenum.  Joann Hebert [facility] 09/23/2018, 10:38 AM   With Mom's permission, a note was sent to secretaries of outpatient Lactation clinic to call Mom to schedule an appt when infant has a CGA of 38-39 weeks. Coralyn Mark

## 2018-10-02 ENCOUNTER — Ambulatory Visit: Payer: Self-pay

## 2018-10-02 NOTE — Lactation Note (Signed)
This note was copied from a baby's chart. Lactation Consultation Note  Patient Name: Joann Hebert ZOXWR'UToday's Date: 10/02/2018     10/02/2018  Name: Joann Laurencesabella Charlotte Devivo MRN: [id number] Date of Birth: 09/21/2020 Gestational Age: Gestational Age: 4524w2d Birth Weight: 128.9 oz Weight today:    7 pounds 14.6 ounces (3590 grams) with clean newborn diaper  11 day old LPT infant presents today with mom and dad for feeding assessment. Infant born at 8536w 2d with AGA of 37w 6d today.   Infant has gained 155 grams in the last 9 days with an average daily weight gain of 17 grams a day.   Infant is self awakening for some feedings and being awakened for others. She is BF during the day with the # 24 NS and feeding for 5-30 minutes usually on one breast. Infant is being supplemented with a bottle after each BF. Infant is bottle feeding at night. Parents changed infant to the Avent Natural Flow nipple and notes infant is drooling and choking less.   Reviewed with parents the infant is LPT infant and it often takes up to about their due date and sometimes beyond for infant to be more efficient at the breast.   Infant fed well needing some stimulation with feeding. She was not able to sustain latch at the breast without the NS. Nipple Tissue non elastic and flattens with stimulation Reviewed with mom that should improve with time.   Infant was latched without the NS. She was on the tip of the nipple and was not swallowing without the NS. She did well with the NS in place.   Infant to follow up with Dr. Vonna Kotykeclaire on 7/24. Infant to follow up with Lactation as needed at parents request.     General Information: Mother's reason for visit: Feeding assessment, LPT infant Consult: Initial Lactation consultant: Noralee StainSharon Nishawn Rotan RN,IBCLC Breastfeeding experience: BF every 2-3 hours during the day and bottle feeds at night. Not consistent with feeding Maternal medical conditions: Pregnancy induced  hypertension Maternal medications: Other, Pre-natal vitamin(Vitamin D, Zoloft)  Breastfeeding History: Frequency of breast feeding: every 2-3 hours during the day Duration of feeding: 5-30 minutes  Supplementation: Supplement method: bottle(Avent Natural Flow, Slow Flow nipple)         Breast milk volume: 20-80 ml Breast milk frequency: 8 x a day   Pump type: Spectra Pump frequency: every 2-3 hours Pump volume: 3 ounces  Infant Output Assessment: Voids per 24 hours: 8 Urine color: Clear yellow Stools per 24 hours: 6 Stool color: Yellow  Breast Assessment: Breast: Soft, Compressible Nipple: Erect(Nipples flatten with stimulation, Nipple tissue inelastic) Pain level: 0 Pain interventions: Bra, Nipple shield, Breast pump, Other, Expressed breast milk(Boob Ease)  Feeding Assessment: Infant oral assessment: Variance Infant oral assessment comment: Infant with thick labial frenulum that inserts at the bottom of the gum ridge. Upper lip flanges well. infant not able to latch to the breast without the NS, Nipple without pain with feedings. Tongue wiht good mobility and strong suction on gloved finger. Positioning: Football(left breast, 15 minutes) Latch: 1 - Repeated attempts needed to sustain latch, nipple held in mouth throughout feeding, stimulation needed to elicit sucking reflex. Audible swallowing: 2 - Spontaneous and intermittent Type of nipple: 2 - Everted at rest and after stimulation Comfort: 2 - Soft/non-tender Hold: 1 - Assistance needed to correctly position infant at breast and maintain latch LATCH score: 8 Latch assessment: Deep Lips flanged: Yes Suck assessment: Displays both Tools: Nipple shield 24 mm  Pre-feed weight: 3590 grams Post feed weight: 3636 grams Amount transferred: 46 ml Amount supplemented: 5 ml EBM via bottle  Additional Feeding Assessment:                                    Totals: Total amount transferred: 46 ml Total  supplement given: 5 ml Total amount pumped post feed: 90 ml   Plan:   1. Offer infant the breast with feeding cues, try to breast feed more during the day and bottle feed more at night Limit breast feeding to 20 minutes and offer a bottle afterwards if infant is sleepy at the breast 2. Keep infant awake at the breast as needed, feed infant skin to skin to help keep her awake 3. Massage/compress breast feeding if infant sleepy at the breast 4. Use the # 24 Nipple shield with feeding as needed to maintain the latch. Try each day without the NS to see when infant is able to feed without it.  5. Offer infant a bottle of pumped breast milk after breast feeding  6. Feed infant using the paced bottle feeding method (video on [email or web address].com) 7. Infant needs about 67-88 ml (2.5-3 ounces) for 8 feedings a day or 525-700 ml (18-23 ounces) in 24 hours. Infant may take more or less depending on how often she feeds. Infant may take more or less depending on how often she feeds.  8. Continue to pump after 7-8 feedings a day to protect your milk supply. Pump with your Double Electric Breast pump for about 15 minutes. A hands free pumping bra may be helpful. Massage breasts with pumping as you can.  While using the Nipple Shield it is recommended that you pump at least 4 x a day.  9. Keep up the good work 10. Thank you for allowing me to assist you today 11. Please call with any question/concerns as needed [phone] 12. Follow up with Lactation as needed.   Debby Freiberg Jolan Mealor RN, IBCLC                                                       Debby Freiberg Edia Pursifull 10/02/2018, 9:28 AM

## 2018-11-01 ENCOUNTER — Other Ambulatory Visit: Payer: Self-pay

## 2018-11-01 DIAGNOSIS — Z20822 Contact with and (suspected) exposure to covid-19: Secondary | ICD-10-CM

## 2018-11-02 ENCOUNTER — Telehealth: Payer: Self-pay | Admitting: *Deleted

## 2018-11-02 NOTE — Telephone Encounter (Signed)
Pt's husband called to get results of pt's COVID test because he tested positive; advised pt's husband that results can take 5-7 business days, and are available first in [place]; he was also advised of necessary precautions to prevent the spread of COVID; he verbalized understanding.

## 2018-11-03 ENCOUNTER — Encounter: Payer: Self-pay | Admitting: Family Medicine

## 2018-11-03 LAB — NOVEL CORONAVIRUS, NAA: SARS-CoV-2, NAA: DETECTED — AB

## 2018-11-04 NOTE — Telephone Encounter (Signed)
See note

## 2018-11-04 NOTE — Telephone Encounter (Signed)
Pt already aware, see note in chart.

## 2018-11-11 ENCOUNTER — Other Ambulatory Visit: Payer: Self-pay

## 2018-11-11 DIAGNOSIS — Z20822 Contact with and (suspected) exposure to covid-19: Secondary | ICD-10-CM

## 2018-11-12 LAB — NOVEL CORONAVIRUS, NAA: SARS-CoV-2, NAA: DETECTED — AB

## 2018-11-20 ENCOUNTER — Other Ambulatory Visit: Payer: Self-pay

## 2018-11-20 DIAGNOSIS — Z20822 Contact with and (suspected) exposure to covid-19: Secondary | ICD-10-CM

## 2018-11-22 LAB — NOVEL CORONAVIRUS, NAA: SARS-CoV-2, NAA: NOT DETECTED

## 2019-01-20 ENCOUNTER — Telehealth: Payer: Self-pay | Admitting: Physician Assistant

## 2019-01-20 NOTE — Telephone Encounter (Signed)
Copied from [place] [id number]. Topic: General - Call Back - No Documentation >> Jan 20, 2019  4:55 PM Erick Blinks wrote: Reason for CRM: Requesting call back to discuss note/form needed that explains her need for a service animal. Please advise,  Best contact: [phone]

## 2019-01-21 NOTE — Telephone Encounter (Signed)
Patient has been scheduled for 01/27/19 @ 11:20 for a virtual visit.

## 2019-01-21 NOTE — Telephone Encounter (Signed)
Please call pt and schedule virtual visit for patient on Friday with Aldona Bar to discuss service animal.

## 2019-01-27 ENCOUNTER — Ambulatory Visit (INDEPENDENT_AMBULATORY_CARE_PROVIDER_SITE_OTHER): Payer: 59 | Admitting: Physician Assistant

## 2019-01-27 ENCOUNTER — Encounter: Payer: Self-pay | Admitting: Physician Assistant

## 2019-01-27 DIAGNOSIS — F419 Anxiety disorder, unspecified: Secondary | ICD-10-CM | POA: Diagnosis not present

## 2019-01-27 DIAGNOSIS — F320 Major depressive disorder, single episode, mild: Secondary | ICD-10-CM | POA: Diagnosis not present

## 2019-01-27 MED ORDER — CLONAZEPAM 0.5 MG PO TABS: 0.5000 mg | ORAL_TABLET | Freq: Two times a day (BID) | ORAL | 1 refills | Status: DC | PRN

## 2019-01-27 NOTE — Progress Notes (Signed)
Virtual Visit via Video   I connected with Joann Hebert on 01/27/19 at 11:20 AM EST by a video enabled telemedicine application and verified that I am speaking with the correct person using two identifiers. Location patient: Home Location provider: [facility], Office Persons participating in the virtual visit: Joann Hebert, [place] PA-C, Joann Mull, LPN   I discussed the limitations of evaluation and management by telemedicine and the availability of in person appointments. The patient expressed understanding and agreed to proceed.  I acted as a Neurosurgeon for [employer], [employer], LPN  Subjective:   HPI:  Pt wants to discuss getting a service animal.  Anxiety & Depression Pt following up today, currently taking Zoloft 50 mg daily.  Patient reports Zoloft  no SI/HI.  She is moving to [facility] soon.  She has her current dog registered as an emotional support animal, and she needs documentation stating that she has a diagnosis of depression and anxiety.  She is doing well, from a postpartum perspective, denies any worsening depression after having her baby.  ROS: See pertinent positives and negatives per HPI.  Patient Active Problem List   Diagnosis Date Noted  . PPROM [redacted]w[redacted]d 09/21/2018  . SVD (spontaneous vaginal delivery) 09/21/2018  . Gestational HTN 09/21/2018  . Postpartum care following vaginal delivery 7/11 09/21/2018  . Perineal laceration, second degree 09/21/2018  . Indication for care in labor or delivery 09/20/2018  . Hyperlipidemia 12/20/2017  . Obesity 12/19/2017  . Anxiety 12/19/2017  . 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 Smoking status: Never Smoker  . Smokeless tobacco: Never Used  Substance Use Topics  . Alcohol use: Yes    Comment: occassionally    Current Outpatient Medications:  .  ibuprofen (ADVIL) 600 MG tablet, Take 1 tablet (600 mg total) by mouth  every 8 (eight) hours as needed for mild pain., Disp: 30 tablet, Rfl: 0 .  Prenat w/o A-[id number]-[id number]-DHA (CITRANATAL ASSURE) 35-1 & 300 MG tablet, , Disp: , Rfl:  .  sertraline (ZOLOFT) 50 MG tablet, Take 50 mg by mouth daily. , Disp: , Rfl:  .  SLYND 4 MG TABS, Take 1 tablet by mouth daily. , Disp: , Rfl:  .  clonazePAM (KLONOPIN) 0.5 MG tablet, Take 1 tablet (0.5 mg total) by mouth 2 (two) times daily as needed for anxiety., Disp: 20 tablet, Rfl: 1  No Known Allergies  Objective:   VITALS: Per patient if applicable, see vitals. GENERAL: Alert, appears well and in no acute distress. HEENT: Atraumatic, conjunctiva clear, no obvious abnormalities on inspection of external nose and ears. NECK: Normal movements of the head and neck. CARDIOPULMONARY: No increased WOB. Speaking in clear sentences. I:E ratio WNL.  MS: Moves all visible extremities without noticeable abnormality. PSYCH: Pleasant and cooperative, well-groomed. Speech normal rate and rhythm. Affect is appropriate. Insight and judgement are appropriate. Attention is focused, linear, and appropriate.  NEURO: CN grossly intact. Oriented as arrived to appointment on time with no prompting. Moves both UE equally.  SKIN: No obvious lesions, wounds, erythema, or cyanosis noted on face or hands.  Assessment and Plan:   Mirna was seen today for discuss service animal, depression and anxiety.  Diagnoses and all orders for this visit:  Depression, major, single episode, mild (HCC); Anxiety Depression is well controlled, is having a increase in her anxiety due to her current plan to move to another state.  I  did give her prescription for Klonopin, she has taken this in the past, she knows that she cannot take this until she is done breast-feeding.  She is currently weaning breast-feeding.  I have completed her letter for her justification of having emotional support animal at this time.  Follow-up if any symptoms worsen or persist in the  interim. I discussed with patient that if they develop any SI, to tell someone immediately and seek medical attention.  She understands that she needs to establish with a new provider when she moves to [place], [place].  Other orders -     clonazePAM (KLONOPIN) 0.5 MG tablet; Take 1 tablet (0.5 mg total) by mouth 2 (two) times daily as needed for anxiety.    . Reviewed expectations re: course of current medical issues. . Discussed self-management of symptoms. . Outlined signs and symptoms indicating need for more acute intervention. . Patient verbalized understanding and all questions were answered. Marland Kitchen Health Maintenance issues including appropriate healthy diet, exercise, and smoking avoidance were discussed with patient. . See orders for this visit as documented in the electronic medical record.  I discussed the assessment and treatment plan with the patient. The patient was provided an opportunity to ask questions and all were answered. The patient agreed with the plan and demonstrated an understanding of the instructions.   The patient was advised to call back or seek an in-person evaluation if the symptoms worsen or if the condition fails to improve as anticipated.   CMA or LPN served as scribe during this visit. History, Physical, and Plan performed by medical provider. The above documentation has been reviewed and is accurate and complete.   [place], [place] 01/27/2019

## 2019-10-13 ENCOUNTER — Telehealth (INDEPENDENT_AMBULATORY_CARE_PROVIDER_SITE_OTHER): Payer: 59 | Admitting: Physician Assistant

## 2019-10-13 ENCOUNTER — Encounter: Payer: Self-pay | Admitting: Physician Assistant

## 2019-10-13 ENCOUNTER — Other Ambulatory Visit: Payer: Self-pay

## 2019-10-13 VITALS — Ht 69.0 in | Wt 216.0 lb

## 2019-10-13 DIAGNOSIS — F419 Anxiety disorder, unspecified: Secondary | ICD-10-CM | POA: Diagnosis not present

## 2019-10-13 DIAGNOSIS — F320 Major depressive disorder, single episode, mild: Secondary | ICD-10-CM | POA: Diagnosis not present

## 2019-10-13 MED ORDER — CLONAZEPAM 0.5 MG PO TABS: 0.5000 mg | ORAL_TABLET | Freq: Two times a day (BID) | ORAL | 1 refills | Status: DC | PRN

## 2019-10-13 MED ORDER — BUPROPION HCL ER (XL) 150 MG PO TB24: 150.0000 mg | ORAL_TABLET | Freq: Every day | ORAL | 1 refills | Status: DC

## 2019-10-13 NOTE — Progress Notes (Signed)
Virtual Visit via Video   I connected with Joann Hebert on 10/13/19 at  4:00 PM EDT by a video enabled telemedicine application and verified that I am speaking with the correct person using two identifiers. Location patient: Home Location provider: [facility], Office Persons participating in the virtual visit: [place], [place] PA-C, [place], [place]   I discussed the limitations of evaluation and management by telemedicine and the availability of in person appointments. The patient expressed understanding and agreed to proceed.  I acted as a Neurosurgeon for [employer], [employer], RMA   Subjective:   HPI:   Anxiety Pt is requesting a refill on Klonopin 0.5 MG. Pt takes this daily as needed for anxiety. Pt also takes Zoloft 50 MG for depression. Feeling a little run down due to motherhood and also because she is planning to move again, despite having just moved.  Denies SI/HI.   Was on Wellbutrin prior to pregnancy and did well with this. She is interested in trialing this.  ROS: See pertinent positives and negatives per HPI.  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 , Disp: , Rfl:    SLYND 4 MG TABS, Take 1 tablet by mouth daily. , Disp: , Rfl:    buPROPion (WELLBUTRIN XL) 150 MG 24 hr tablet, Take 1 tablet (150 mg total) by mouth daily., Disp: 90 tablet, Rfl: 1  No Known Allergies  Objective:   VITALS: Per patient if applicable, see vitals. GENERAL: Alert, appears well and in no acute distress. HEENT: Atraumatic, conjunctiva clear, no obvious abnormalities on inspection of external nose and ears. NECK: Normal movements of the head and neck. CARDIOPULMONARY: No increased WOB. Speaking in clear sentences. I:E ratio WNL.  MS: Moves all visible extremities without noticeable abnormality. PSYCH: Pleasant and cooperative, well-groomed. Speech normal rate and rhythm. Affect is appropriate. Insight and judgement are appropriate. Attention is focused, linear, and appropriate.  NEURO: CN grossly intact. Oriented as arrived to appointment on time with no prompting. Moves both UE equally.  SKIN: No obvious lesions, wounds, erythema, or cyanosis noted on face or hands.  Assessment and Plan:   Joann Hebert was seen today for anxiety and medication refill.  Diagnoses and all orders for this visit:  Anxiety; Depression, major, single episode, mild (HCC) Uncontrolled.  Start Wellbutrin 150 mg daily.  Continue Zoloft 50 mg -- however I did give instructions via MyChart regarding how to taper this off if she would like. Follow-up in the fall, sooner if concerns. -     clonazePAM (KLONOPIN) 0.5 MG tablet; Take 1 tablet (0.5 mg total) by mouth 2 (  two) times daily as needed for anxiety.  Other orders -     buPROPion (WELLBUTRIN XL) 150 MG 24 hr tablet; Take 1 tablet (150 mg total) by mouth daily.     Reviewed expectations re: course of current medical issues.  Discussed self-management of symptoms.  Outlined signs and symptoms indicating need for more acute intervention.  Patient verbalized understanding and all questions  were answered.  Health Maintenance issues including appropriate healthy diet, exercise, and smoking avoidance were discussed with patient.  See orders for this visit as documented in the electronic medical record.  I discussed the assessment and treatment plan with the patient. The patient was provided an opportunity to ask questions and all were answered. The patient agreed with the plan and demonstrated an understanding of the instructions.   The patient was advised to call back or seek an in-person evaluation if the symptoms worsen or if the condition fails to improve as anticipated.   CMA or LPN served as scribe during this visit. History, Physical, and Plan performed by medical provider. The above documentation has been reviewed and is accurate and complete.   [place], [place] 10/13/2019

## 2020-03-17 ENCOUNTER — Ambulatory Visit: Payer: 59 | Admitting: Physician Assistant

## 2020-03-23 ENCOUNTER — Ambulatory Visit: Payer: 59 | Admitting: Physician Assistant

## 2020-04-13 ENCOUNTER — Other Ambulatory Visit: Payer: Self-pay | Admitting: Physician Assistant

## 2020-04-16 ENCOUNTER — Encounter: Payer: Self-pay | Admitting: Physician Assistant

## 2020-04-16 ENCOUNTER — Ambulatory Visit: Payer: BC Managed Care – PPO | Admitting: Physician Assistant

## 2020-04-16 ENCOUNTER — Other Ambulatory Visit: Payer: Self-pay

## 2020-04-16 VITALS — BP 118/82 | HR 82 | Temp 97.6°F | Ht 69.0 in | Wt 216.0 lb

## 2020-04-16 DIAGNOSIS — Z0001 Encounter for general adult medical examination with abnormal findings: Secondary | ICD-10-CM | POA: Diagnosis not present

## 2020-04-16 DIAGNOSIS — F419 Anxiety disorder, unspecified: Secondary | ICD-10-CM | POA: Diagnosis not present

## 2020-04-16 DIAGNOSIS — E669 Obesity, unspecified: Secondary | ICD-10-CM | POA: Diagnosis not present

## 2020-04-16 DIAGNOSIS — R2231 Localized swelling, mass and lump, right upper limb: Secondary | ICD-10-CM

## 2020-04-16 DIAGNOSIS — F320 Major depressive disorder, single episode, mild: Secondary | ICD-10-CM | POA: Diagnosis not present

## 2020-04-16 LAB — COMPREHENSIVE METABOLIC PANEL
ALT: 13 U/L (ref 0–35)
AST: 14 U/L (ref 0–37)
Albumin: 4.8 g/dL (ref 3.5–5.2)
Alkaline Phosphatase: 53 U/L (ref 39–117)
BUN: 15 mg/dL (ref 6–23)
CO2: 25 mEq/L (ref 19–32)
Calcium: 9.7 mg/dL (ref 8.4–10.5)
Chloride: 106 mEq/L (ref 96–112)
Creatinine, Ser: 0.92 mg/dL (ref 0.40–1.20)
GFR: 83.04 mL/min (ref >60.00)
Glucose, Bld: 104 mg/dL — ABNORMAL HIGH (ref 70–99)
Potassium: 4.3 mEq/L (ref 3.5–5.1)
Sodium: 138 mEq/L (ref 135–145)
Total Bilirubin: 0.4 mg/dL (ref 0.2–1.2)
Total Protein: 7.5 g/dL (ref 6.0–8.3)

## 2020-04-16 LAB — LIPID PANEL
Cholesterol: 201 mg/dL — ABNORMAL HIGH (ref 0–200)
HDL: 36.4 mg/dL — ABNORMAL LOW (ref >39.00)
NonHDL: 164.83
Total CHOL/HDL Ratio: 6
Triglycerides: 238 mg/dL — ABNORMAL HIGH (ref 0.0–149.0)
VLDL: 47.6 mg/dL — ABNORMAL HIGH (ref 0.0–40.0)

## 2020-04-16 LAB — CBC WITH DIFFERENTIAL/PLATELET
Basophils Absolute: 0 10*3/uL (ref 0.0–0.1)
Basophils Relative: 0.4 % (ref 0.0–3.0)
Eosinophils Absolute: 0.3 10*3/uL (ref 0.0–0.7)
Eosinophils Relative: 4.9 % (ref 0.0–5.0)
HCT: 39.7 % (ref 36.0–46.0)
Hemoglobin: 13.6 g/dL (ref 12.0–15.0)
Lymphocytes Relative: 42.2 % (ref 12.0–46.0)
Lymphs Abs: 2.4 10*3/uL (ref 0.7–4.0)
MCHC: 34.2 g/dL (ref 30.0–36.0)
MCV: 87.5 fl (ref 78.0–100.0)
Monocytes Absolute: 0.4 10*3/uL (ref 0.1–1.0)
Monocytes Relative: 6.6 % (ref 3.0–12.0)
Neutro Abs: 2.6 10*3/uL (ref 1.4–7.7)
Neutrophils Relative %: 45.9 % (ref 43.0–77.0)
Platelets: 308 10*3/uL (ref 150.0–400.0)
RBC: 4.54 Mil/uL (ref 3.87–5.11)
RDW: 13 % (ref 11.5–15.5)
WBC: 5.8 10*3/uL (ref 4.0–10.5)

## 2020-04-16 LAB — LDL CHOLESTEROL, DIRECT: Direct LDL: 139 mg/dL

## 2020-04-16 MED ORDER — BUPROPION HCL ER (XL) 300 MG PO TB24: 300.0000 mg | ORAL_TABLET | Freq: Every day | ORAL | 1 refills | Status: DC

## 2020-04-16 MED ORDER — CLONAZEPAM 0.5 MG PO TABS: 0.5000 mg | ORAL_TABLET | Freq: Two times a day (BID) | ORAL | 1 refills | Status: DC | PRN

## 2020-04-16 NOTE — Patient Instructions (Signed)
It was great to see you!  Let me know in about 3 months how the wellbutrin increase.  We will send you a referral to orthopedics, if you don't hear anything after a month, let us know.  Please go to the lab for blood work.   Our office will call you with your results unless you have chosen to receive results via MyChart.  If your blood work is normal we will follow-up each year for physicals and as scheduled for chronic medical problems.  If anything is abnormal we will treat accordingly and get you in for a follow-up.  Take care,  [facility] Maintenance, Female Adopting a healthy lifestyle and getting preventive care are important in promoting health and wellness. Ask your health care provider about:  The right schedule for you to have regular tests and exams.  Things you can do on your own to prevent diseases and keep yourself healthy. What should I know about diet, weight, and exercise? Eat a healthy diet  Eat a diet that includes plenty of vegetables, fruits, low-fat dairy products, and lean protein.  Do not eat a lot of foods that are high in solid fats, added sugars, or sodium.   Maintain a healthy weight Body mass index (BMI) is used to identify weight problems. It estimates body fat based on height and weight. Your health care provider can help determine your BMI and help you achieve or maintain a healthy weight. Get regular exercise Get regular exercise. This is one of the most important things you can do for your health. Most adults should:  Exercise for at least 150 minutes each week. The exercise should increase your heart rate and make you sweat (moderate-intensity exercise).  Do strengthening exercises at least twice a week. This is in addition to the moderate-intensity exercise.  Spend less time sitting. Even light physical activity can be beneficial. Watch cholesterol and blood lipids Have your blood tested for lipids and cholesterol at 32 years of  age, then have this test every 5 years. Have your cholesterol levels checked more often if:  Your lipid or cholesterol levels are high.  You are older than 32 years of age.  You are at high risk for heart disease. What should I know about cancer screening? Depending on your health history and family history, you may need to have cancer screening at various ages. This may include screening for:  Breast cancer.  Cervical cancer.  Colorectal cancer.  Skin cancer.  Lung cancer. What should I know about heart disease, diabetes, and high blood pressure? Blood pressure and heart disease  High blood pressure causes heart disease and increases the risk of stroke. This is more likely to develop in people who have high blood pressure readings, are of African descent, or are overweight.  Have your blood pressure checked: ? Every 3-5 years if you are 68-70 years of age. ? Every year if you are 91 years old or older. Diabetes Have regular diabetes screenings. This checks your fasting blood sugar level. Have the screening done:  Once every three years after age 33 if you are at a normal weight and have a low risk for diabetes.  More often and at a younger age if you are overweight or have a high risk for diabetes. What should I know about preventing infection? Hepatitis B If you have a higher risk for hepatitis B, you should be screened for this virus. Talk with your health care provider to find  out if you are at risk for hepatitis B infection. Hepatitis C Testing is recommended for:  Everyone born from [birth year] through 1967.  Anyone with known risk factors for hepatitis C. Sexually transmitted infections (STIs)  Get screened for STIs, including gonorrhea and chlamydia, if: ? You are sexually active and are younger than 32 years of age. ? You are older than 32 years of age and your health care provider tells you that you are at risk for this type of infection. ? Your sexual activity has  changed since you were last screened, and you are at increased risk for chlamydia or gonorrhea. Ask your health care provider if you are at risk.  Ask your health care provider about whether you are at high risk for HIV. Your health care provider may recommend a prescription medicine to help prevent HIV infection. If you choose to take medicine to prevent HIV, you should first get tested for HIV. You should then be tested every 3 months for as long as you are taking the medicine. Pregnancy  If you are about to stop having your period (premenopausal) and you may become pregnant, seek counseling before you get pregnant.  Take 400 to 800 micrograms (mcg) of folic acid every day if you become pregnant.  Ask for birth control (contraception) if you want to prevent pregnancy. Osteoporosis and menopause Osteoporosis is a disease in which the bones lose minerals and strength with aging. This can result in bone fractures. If you are 36 years old or older, or if you are at risk for osteoporosis and fractures, ask your health care provider if you should:  Be screened for bone loss.  Take a calcium or vitamin D supplement to lower your risk of fractures.  Be given hormone replacement therapy (HRT) to treat symptoms of menopause. Follow these instructions at home: Lifestyle  Do not use any products that contain nicotine or tobacco, such as cigarettes, e-cigarettes, and chewing tobacco. If you need help quitting, ask your health care provider.  Do not use street drugs.  Do not share needles.  Ask your health care provider for help if you need support or information about quitting drugs. Alcohol use  Do not drink alcohol if: ? Your health care provider tells you not to drink. ? You are pregnant, may be pregnant, or are planning to become pregnant.  If you drink alcohol: ? Limit how much you use to 0-1 drink a day. ? Limit intake if you are breastfeeding.  Be aware of how much alcohol is in  your drink. In the [place] one drink equals one 12 oz bottle of beer (355 mL), one 5 oz glass of wine (148 mL), or one 1 oz glass of hard liquor (44 mL). General instructions  Schedule regular health, dental, and eye exams.  Stay current with your vaccines.  Tell your health care provider if: ? You often feel depressed. ? You have ever been abused or do not feel safe at home. Summary  Adopting a healthy lifestyle and getting preventive care are important in promoting health and wellness.  Follow your health care provider's instructions about healthy diet, exercising, and getting tested or screened for diseases.  Follow your health care provider's instructions on monitoring your cholesterol and blood pressure. This information is not intended to replace advice given to you by your health care provider. Make sure you discuss any questions you have with your health care provider. Document Revised: 02/20/2018 Document Reviewed: 02/20/2018 Elsevier Patient Education  [place].

## 2020-04-16 NOTE — Progress Notes (Signed)
Subjective:    Joann Hebert is a 30 y.o. female and is here for a comprehensive physical exam.  HPI  Health Maintenance Due  Topic Date Due  . PAP SMEAR-Modifier  08/30/2017  . 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 Has had for at least 4-5 years. Had a CT scan or MRI of this and was told by the general surgeon that she will need to see an orthopedist to have this removed due to the location. It causes pain with certain movements.  Chronic Issues: Depression/anxiety -- currently taking wellbutrin 150 mg XL daily and 0.5 mg klonopin prn. She is overall doing ok, but would like to see if increase can help her with more motivation and some intermittent down thoughts. Denies SI/HI.  Health Maintenance: Immunizations -- Flu shot in October; needs to get booster Colonoscopy -- n/a Mammogram -- n/a PAP -- overdue -- counseled -- she is planning to establish with ob-gyn soon Diet -- has limited sweets intakes Sleep habits -- overall ok Exercise -- working out 3-4 days per week Current Weight -- Weight: 216 lb (98 kg)  Weight History: Wt Readings from Last 10 Encounters:  04/16/20 216 lb (98 kg)  10/13/19 216 lb (98 kg)  09/20/18 258 lb (117 kg)  08/19/18 235 lb (106.6 kg)  02/11/18 208 lb (94.3 kg)  12/19/17 208 lb 6.1 oz (94.5 kg)   Body mass index is 31.9 kg/m. Mood -- stable, having more depressive thoughts at times Patient's last menstrual period was 04/12/2020. Period characteristics -- no significant concerns Birth control -- none Alcohol -- rare intake  Depression screen PHQ 2/9 04/16/2020  Decreased Interest 1  Down, Depressed, Hopeless 1  PHQ - 2 Score 2  Altered sleeping 1  Tired, decreased energy 2  Change in appetite 2  Feeling bad or failure about yourself  2  Trouble concentrating 1  Moving slowly or fidgety/restless 0  Suicidal thoughts 0  PHQ-9 Score 10   Difficult doing work/chores Somewhat difficult     Other providers/specialists: Patient Care Team: Joann Hebert, [place] as PCP - General (Physician Assistant)   PMHx, SurgHx, SocialHx, Medications, and Allergies were reviewed in the Visit Navigator and updated as appropriate.   Past Medical History:  Diagnosis Date  . Anxiety   . Depression   . Hypertension   . Migraines    has tried imitrex     Past Surgical History:  Procedure Laterality Date  . WISDOM TOOTH EXTRACTION Bilateral 2009     Family History  Problem Relation Age of Onset  . Depression Father   . Diabetes Father   . Heart attack Father   . Hyperlipidemia Father   . Hypertension Father   . Depression Brother   . Alcohol abuse Maternal Grandmother   . Osteoarthritis Maternal Grandmother   . Diabetes Maternal Grandmother   . Hyperlipidemia Maternal Grandmother   . Hypertension Maternal Grandmother   . Miscarriages / Stillbirths Maternal Grandmother   . Diabetes Maternal Grandfather   . Hyperlipidemia Maternal Grandfather   . Hypertension Maternal Grandfather   . Kidney disease Maternal Grandfather   . Depression Paternal Grandfather   . Heart attack Paternal Grandfather   . Colon cancer Neg Hx   . Breast cancer Neg Hx     Social History   Tobacco Use  . Smoking status: Never Smoker  . Smokeless tobacco:  Never Used  Vaping Use  . Vaping Use: Never used  Substance Use Topics  . Alcohol use: Yes    Comment: occassionally  . Drug use: Never    Review of Systems:   Review of Systems  Constitutional: Negative for chills, fever, malaise/fatigue and weight loss.  HENT: Negative for hearing loss, sinus pain and sore throat.   Respiratory: Negative for cough and hemoptysis.   Cardiovascular: Negative for chest pain, palpitations, leg swelling and PND.  Gastrointestinal: Negative for abdominal pain, constipation, diarrhea, heartburn, nausea and vomiting.  Genitourinary: Negative for dysuria,  frequency and urgency.  Musculoskeletal: Negative for back pain, myalgias and neck pain.  Skin: Negative for itching and rash.  Neurological: Negative for dizziness, tingling, seizures and headaches.  Endo/Heme/Allergies: Negative for polydipsia.  Psychiatric/Behavioral: Negative for depression. The patient is not nervous/anxious.     Objective:   BP 118/82 (BP Location: Left Arm, Patient Position: Sitting, Cuff Size: Large)   Pulse 82   Temp 97.6 F (36.4 C) (Temporal)   Ht 5\' 9"  (1.753 m)   Wt 216 lb (98 kg)   LMP 04/12/2020   SpO2 99%   BMI 31.90 kg/m   General Appearance:    Alert, cooperative, no distress, appears stated age  Head:    Normocephalic, without obvious abnormality, atraumatic  Eyes:    PERRL, conjunctiva/corneas clear, EOM's intact, fundi    benign, both eyes  Ears:    Normal TM's and external ear canals, both ears  Nose:   Nares normal, septum midline, mucosa normal, no drainage    or sinus tenderness  Throat:   Lips, mucosa, and tongue normal; teeth and gums normal  Neck:   Supple, symmetrical, trachea midline, no adenopathy;    thyroid:  no enlargement/tenderness/nodules; no carotid   bruit or JVD  Back:     Symmetric, no curvature, ROM normal, no CVA tenderness  Lungs:     Clear to auscultation bilaterally, respirations unlabored  Chest Wall:    No tenderness or deformity   Heart:    Regular rate and rhythm, S1 and S2 normal, no murmur, rub   or gallop  Breast Exam:    Deferred  Abdomen:     Soft, non-tender, bowel sounds active all four quadrants,    no masses, no organomegaly  Genitalia:    Deferred  Rectal:    Deferred  Extremities:   Extremities normal, atraumatic, no cyanosis or edema R elbow with fixed golf ball sized mass without TTP or erythema  Pulses:   2+ and symmetric all extremities  Skin:   Skin color, texture, turgor normal, no rashes or lesions  Lymph nodes:   Cervical, supraclavicular, and axillary nodes normal  Neurologic:    CNII-XII intact, normal strength, sensation and reflexes    throughout     Assessment/Plan:   Joann Hebert was seen today for form completion.  Diagnoses and all orders for this visit:  Encounter for general adult medical examination with abnormal findings Today patient counseled on age appropriate routine health concerns for screening and prevention, each reviewed and up to date or declined. Immunizations reviewed and up to date or declined. Labs ordered and reviewed. Risk factors for depression reviewed and negative. Hearing function and visual acuity are intact. ADLs screened and addressed as needed. Functional ability and level of safety reviewed and appropriate. Education, counseling and referrals performed based on assessed risks today. Patient provided with a copy of personalized plan for preventive services.  Anxiety; Depression, major, single  episode, mild (HCC) Uncontrolled. Denies SI/HI. Increase Wellbutrin to 300 mg daily. Refill clonopin as is. Follow-up in 3 months, sooner if concerns. -     CBC with Differential/Platelet -     Comprehensive metabolic panel -     clonazePAM (KLONOPIN) 0.5 MG tablet; Take 1 tablet (0.5 mg total) by mouth 2 (two) times daily as needed for anxiety.  Obesity, unspecified classification, unspecified obesity type, unspecified whether serious comorbidity present Continue to work on diet and exercise as able. -     Lipid panel  Mass of right elbow Referral to orthopedic surgery per patient request. -     Ambulatory referral to Orthopedic Surgery  Other orders -     buPROPion (WELLBUTRIN XL) 300 MG 24 hr tablet; Take 1 tablet (300 mg total) by mouth daily.   Well Adult Exam: Labs ordered: Yes. Patient counseling was done. See below for items discussed. Discussed the patient's BMI. The BMI is not in the acceptable range; BMI management plan is completed Follow up in 3 months.  Patient Counseling:   [x]     Nutrition: Stressed importance of  moderation in sodium/caffeine intake, saturated fat and cholesterol, caloric balance, sufficient intake of fresh fruits, vegetables, fiber, calcium, iron, and 1 mg of folate supplement per day (for females capable of pregnancy).   [x]      Stressed the importance of regular exercise.    [x]     Substance Abuse: Discussed cessation/primary prevention of tobacco, alcohol, or other drug use; driving or other dangerous activities under the influence; availability of treatment for abuse.    [x]      Injury prevention: Discussed safety belts, safety helmets, smoke detector, smoking near bedding or upholstery.    [x]      Sexuality: Discussed sexually transmitted diseases, partner selection, use of condoms, avoidance of unintended pregnancy  and contraceptive alternatives.    [x]     Dental health: Discussed importance of regular tooth brushing, flossing, and dental visits.   [x]      Health maintenance and immunizations reviewed. Please refer to Health maintenance section.   CMA or LPN served as scribe during this visit. History, Physical, and Plan performed by medical provider. The above documentation has been reviewed and is accurate and complete.    , PA-C [facility]

## 2020-05-04 ENCOUNTER — Encounter: Payer: Self-pay | Admitting: Orthopaedic Surgery

## 2020-05-04 ENCOUNTER — Other Ambulatory Visit: Payer: Self-pay

## 2020-05-04 ENCOUNTER — Ambulatory Visit: Payer: BC Managed Care – PPO | Admitting: Orthopaedic Surgery

## 2020-05-04 ENCOUNTER — Ambulatory Visit: Payer: Self-pay

## 2020-05-04 VITALS — Ht 68.0 in | Wt 216.0 lb

## 2020-05-04 DIAGNOSIS — R2231 Localized swelling, mass and lump, right upper limb: Secondary | ICD-10-CM

## 2020-05-04 NOTE — Addendum Note (Signed)
Addended by: Albertina Parr on: 05/04/2020 04:33 PM   Modules accepted: Orders

## 2020-05-04 NOTE — Progress Notes (Signed)
Office Visit Note   Patient: Joann Hebert           Date of Birth: 20-Jan-1991           MRN: [id number] Visit Date: 05/04/2020              Requested by: Jarold Motto, [place] [address] [place],  [place] [address] PCP: Jarold Motto, [place]   Assessment & Plan: Visit Diagnoses:  1. Mass of elbow region, right     Plan: Impression is right elbow mass.  Looked at the MRI from a few years ago which shows a heterogeneous mass in the subcutaneous tissue.  My concern is for malignancy therefore we will need to get a contrasted MRI to fully evaluate this mass.  I will be in touch with the patient regarding the results.  Follow-Up Instructions: Return if symptoms worsen or fail to improve.   Orders:  Orders Placed This Encounter  Procedures  . XR Elbow 2 Views Right   No orders of the defined types were placed in this encounter.     Procedures: No procedures performed   Clinical Data: No additional findings.   Subjective: No chief complaint on file.   Joann Hebert is a very pleasant 30 year old female here for evaluation of a chronic right elbow mass that she initially noticed a few years ago when she lived in [place].  She underwent an MRI at that time which showed a mass in the posterior lateral region of the elbow.  She never was able to have this definitively treated due to changes in health insurance.  She has since relocated to [facility].  She mainly has pain with contact and activity but she still retains full range of motion.  Denies any constitutional symptoms or personal history of cancer.  She feels that the size has been consistent in the last 2 years.  Denies any injuries to this area.   Review of Systems  Constitutional: Negative.   HENT: Negative.   Eyes: Negative.   Respiratory: Negative.   Cardiovascular: Negative.   Endocrine: Negative.   Musculoskeletal: Negative.   Neurological: Negative.   Hematological: Negative.   Psychiatric/Behavioral:  Negative.   All other systems reviewed and are negative.    Objective: Vital Signs: Ht 5\' 8"  (1.727 m)   Wt 216 lb (98 kg)   LMP 04/12/2020   BMI 32.84 kg/m   Physical Exam Vitals and nursing note reviewed.  Constitutional:      Appearance: She is well-developed and well-nourished.  HENT:     Head: Normocephalic and atraumatic.  Eyes:     Extraocular Movements: EOM normal.  Pulmonary:     Effort: Pulmonary effort is normal.  Abdominal:     Palpations: Abdomen is soft.  Musculoskeletal:     Cervical back: Neck supple.  Skin:    General: Skin is warm.     Capillary Refill: Capillary refill takes less than 2 seconds.  Neurological:     Mental Status: She is alert and oriented to person, place, and time.  Psychiatric:        Mood and Affect: Mood and affect normal.        Behavior: Behavior normal.        Thought Content: Thought content normal.        Judgment: Judgment normal.     Ortho Exam Right elbow shows a firm large soft tissue mass on the posterior lateral aspect of the elbow.  There is no overlying  skin changes.  The mass is not mobile.  She has full range of motion of the elbow.  No neurovascular compromise.  Slight tenderness to palpation of the mass.  Mass is greater than 5 cm in diameter. Specialty Comments:  No specialty comments available.  Imaging: XR Elbow 2 Views Right  Result Date: 05/04/2020 Soft tissue mass in the posterior aspect of the elbow with multiple small calcifications.  No acute abnormalities.    PMFS History: Patient Active Problem List   Diagnosis Date Noted  . PPROM [redacted]w[redacted]d 09/21/2018  . SVD (spontaneous vaginal delivery) 09/21/2018  . Gestational HTN 09/21/2018  . Postpartum care following vaginal delivery 7/11 09/21/2018  . Perineal laceration, second degree 09/21/2018  . Indication for care in labor or delivery 09/20/2018  . Hyperlipidemia 12/20/2017  . Obesity 12/19/2017  . Anxiety 12/19/2017  . Depression, major, single  episode, mild (HCC) 12/19/2017   Past Medical History:  Diagnosis Date  . Anxiety   . Depression   . Hypertension   . Migraines    has tried imitrex    Family History  Problem Relation Age of Onset  . Depression Father   . Diabetes Father   . Heart attack Father   . Hyperlipidemia Father   . Hypertension Father   . Depression Brother   . Alcohol abuse Maternal Grandmother   . Osteoarthritis Maternal Grandmother   . Diabetes Maternal Grandmother   . Hyperlipidemia Maternal Grandmother   . Hypertension Maternal Grandmother   . Miscarriages / Stillbirths Maternal Grandmother   . Diabetes Maternal Grandfather   . Hyperlipidemia Maternal Grandfather   . Hypertension Maternal Grandfather   . Kidney disease Maternal Grandfather   . Depression Paternal Grandfather   . Heart attack Paternal Grandfather   . Colon cancer Neg Hx   . Breast cancer Neg Hx     Past Surgical History:  Procedure Laterality Date  . WISDOM TOOTH EXTRACTION Bilateral 2009   Social History   Occupational History  . Not on file  Tobacco Use  . Smoking status: Never Smoker  . Smokeless tobacco: Never Used  Vaping Use  . Vaping Use: Never used  Substance and Sexual Activity  . Alcohol use: Yes    Comment: occassionally  . Drug use: Never  . Sexual activity: Yes    Birth control/protection: None

## 2020-05-06 ENCOUNTER — Encounter: Payer: Self-pay | Admitting: *Deleted

## 2020-05-15 ENCOUNTER — Other Ambulatory Visit: Payer: Self-pay

## 2020-05-15 ENCOUNTER — Ambulatory Visit
Admission: RE | Admit: 2020-05-15 | Discharge: 2020-05-15 | Disposition: A | Discharge status: Home / Self Care | Payer: BC Managed Care – PPO | Source: Ambulatory Visit | Attending: Orthopaedic Surgery | Admitting: Orthopaedic Surgery

## 2020-05-15 DIAGNOSIS — R2231 Localized swelling, mass and lump, right upper limb: Secondary | ICD-10-CM

## 2020-05-15 DIAGNOSIS — R222 Localized swelling, mass and lump, trunk: Secondary | ICD-10-CM | POA: Diagnosis not present

## 2020-05-15 DIAGNOSIS — R220 Localized swelling, mass and lump, head: Secondary | ICD-10-CM | POA: Diagnosis not present

## 2020-05-15 MED ORDER — GADOBENATE DIMEGLUMINE 529 MG/ML IV SOLN
20.0000 mL | Freq: Once | INTRAVENOUS | Status: AC | PRN
  Administered 2020-05-15: 20 mL via INTRAVENOUS

## 2020-05-16 NOTE — Progress Notes (Signed)
Spoke to patient and reviewed MRI results.  Needs urgent referral to Dr. 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  Please cancel her appointment with me for this Friday.  Thanks.

## 2020-05-17 ENCOUNTER — Telehealth: Payer: Self-pay | Admitting: Orthopaedic Surgery

## 2020-05-17 ENCOUNTER — Telehealth: Payer: Self-pay

## 2020-05-17 ENCOUNTER — Other Ambulatory Visit: Payer: Self-pay

## 2020-05-17 DIAGNOSIS — R2231 Localized swelling, mass and lump, right upper limb: Secondary | ICD-10-CM

## 2020-05-17 NOTE — Telephone Encounter (Signed)
Patient called regarding mri she would like a call back asap due to her results coming back a being cancerous call back:[phone] call husband if she doesn't answer [phone]

## 2020-05-17 NOTE — Telephone Encounter (Signed)
FYI Spoke with patient's husband Leonette Most advised note was sent to Dr. Roda Shutters concerning patient and the referring was marked urgent and Martie Lee is working on the referral.  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 The number to contact Leonette Most is   [phone]

## 2020-05-17 NOTE — Telephone Encounter (Signed)
See message. I thought you spoke to her?   Referral already made-Urgent.

## 2020-05-18 NOTE — Telephone Encounter (Signed)
Spoke to husband and explained.

## 2020-05-21 ENCOUNTER — Ambulatory Visit: Payer: BC Managed Care – PPO | Admitting: Orthopaedic Surgery

## 2020-05-24 ENCOUNTER — Telehealth: Payer: Self-pay | Admitting: Orthopaedic Surgery

## 2020-05-24 NOTE — Telephone Encounter (Signed)
Pt called and states She was suppose to be referred to the cancer center in [place] and she wants to know if you can go ahead and send that referral if you haven't already. Also they told her all of her stuff for right elbow has to be sent to them through [employer]? [phone]

## 2020-05-25 DIAGNOSIS — D1601 Benign neoplasm of scapula and long bones of right upper limb: Secondary | ICD-10-CM | POA: Diagnosis not present

## 2020-05-25 DIAGNOSIS — M7989 Other specified soft tissue disorders: Secondary | ICD-10-CM | POA: Diagnosis not present

## 2020-05-25 DIAGNOSIS — R2231 Localized swelling, mass and lump, right upper limb: Secondary | ICD-10-CM | POA: Diagnosis not present

## 2020-06-25 DIAGNOSIS — D2111 Benign neoplasm of connective and other soft tissue of right upper limb, including shoulder: Secondary | ICD-10-CM | POA: Diagnosis not present

## 2020-06-25 DIAGNOSIS — M7989 Other specified soft tissue disorders: Secondary | ICD-10-CM | POA: Diagnosis not present

## 2020-06-25 DIAGNOSIS — Q2731 Arteriovenous malformation of vessel of upper limb: Secondary | ICD-10-CM | POA: Diagnosis not present

## 2020-06-25 HISTORY — PX: HEMANGIOMA EXCISION: SHX1734

## 2020-07-14 ENCOUNTER — Telehealth: Payer: BC Managed Care – PPO | Admitting: Physician Assistant

## 2020-07-22 ENCOUNTER — Telehealth (INDEPENDENT_AMBULATORY_CARE_PROVIDER_SITE_OTHER): Payer: BC Managed Care – PPO | Admitting: Family Medicine

## 2020-07-22 ENCOUNTER — Telehealth: Payer: Self-pay

## 2020-07-22 DIAGNOSIS — F419 Anxiety disorder, unspecified: Secondary | ICD-10-CM

## 2020-07-22 DIAGNOSIS — R0981 Nasal congestion: Secondary | ICD-10-CM

## 2020-07-22 MED ORDER — BENZONATATE 100 MG PO CAPS: 100.0000 mg | ORAL_CAPSULE | Freq: Three times a day (TID) | ORAL | 0 refills | Status: DC | PRN

## 2020-07-22 MED ORDER — CLONAZEPAM 0.5 MG PO TABS: 0.5000 mg | ORAL_TABLET | Freq: Two times a day (BID) | ORAL | 0 refills | Status: DC | PRN

## 2020-07-22 MED ORDER — AMOXICILLIN-POT CLAVULANATE 875-125 MG PO TABS: 1.0000 | ORAL_TABLET | Freq: Two times a day (BID) | ORAL | 0 refills | Status: DC

## 2020-07-22 NOTE — Telephone Encounter (Signed)
MEDICATION: clonazePAM (KLONOPIN) 0.5 MG tablet  PHARMACY:  [id number] [place], [place] - [address] Phone:  [phone]  Fax:  [phone]       Comments:   **Let patient know to contact pharmacy at the end of the day to make sure medication is ready. **  ** Please notify patient to allow 48-72 hours to process**  **Encourage patient to contact the pharmacy for refills or they can request refills through [facility]**

## 2020-07-22 NOTE — Telephone Encounter (Signed)
Left message to return call to our office at their convenience. Need OV for refills

## 2020-07-22 NOTE — Patient Instructions (Addendum)
-  I sent the medication(s) we discussed to your pharmacy: Meds ordered this encounter  Medications  . amoxicillin-clavulanate (AUGMENTIN) 875-125 MG tablet    Sig: Take 1 tablet by mouth 2 (two) times daily.    Dispense:  20 tablet    Refill:  0  . benzonatate (TESSALON PERLES) 100 MG capsule    Sig: Take 1 capsule (100 mg total) by mouth 3 (three) times daily as needed.    Dispense:  20 capsule    Refill:  0   Nasal saline twice daily.  Afrin nasal spray for 3 days.   I hope you are feeling better soon!  Seek in person care promptly if your symptoms worsen, new concerns arise or you are not improving with treatment.  It was nice to meet you today. I help [facility] out with telemedicine visits on Tuesdays and Thursdays and am available for visits on those days. If you have any concerns or questions following this visit please schedule a follow up visit with your Primary Care doctor or seek care at a local urgent care clinic to avoid delays in care.

## 2020-07-22 NOTE — Progress Notes (Signed)
Virtual Visit via Telephone Note  I connected with Joann Hebert on 07/22/20 at  1:20 PM EDT by telephone and verified that I am speaking with the correct person using two identifiers.   I discussed the limitations, risks, security and privacy concerns of performing an evaluation and management service by telephone and the availability of in person appointments. I also discussed with the patient that there may be a patient responsible charge related to this service. The patient expressed understanding and agreed to proceed.  Location patient: home, [place] Location provider: work or home office Participants present for the call: patient, provider Patient did not have a visit with me in the prior 7 days to address this/these issue(s).   History of Present Illness:  Acute telemedicine visit for "a sinus infection": -Onset: 2 weeks ago -Symptoms include: nasal congestion, now green and thick nasal congestion, some sinus discomfort as well, cough, PND, now ears are full and L ear hurts and pops, wheezy feeling at times when cough -Denies: fevers, CP, SOB, body aches, NVD, inability to eat/drink/get out of bed -did 2 covid tests at home - both negative -Pertinent past medical history: reports has EIB remotely -Pertinent medication allergies: No Known Allergies  -denies any chance of pregnancy -COVID-19 vaccine status: fully vaccinated and had a booster; had flu shot as well   Observations/Objective: Patient sounds cheerful and well on the phone. I do not appreciate any SOB. Speech and thought processing are grossly intact. Patient reported vitals:  Assessment and Plan:  Sinus congestion  -we discussed possible serious and likely etiologies, options for evaluation and workup, limitations of telemedicine visit vs in person visit, treatment, treatment risks and precautions. Pt prefers to treat via telemedicine empirically rather than in person at this moment. Given the duration of symptoms and  worsening she opted for treatment with augmentin for possible bacterial sinusitis. Also advised nasal saline and short course of nasal decongestant.  Work/School slipped offered:declined Scheduled follow up with PCP offered: agrees to follow up as needed. Advised to seek prompt in person care if worsening, new symptoms arise, or if is not improving with treatment. Advised of options for inperson care in case PCP office not available. Did let the patient know that I only do telemedicine shifts for [place] on Tuesdays and Thursdays and advised a follow up visit with PCP or at an [facility] if has further questions or concerns.   Follow Up Instructions:  I did not refer this patient for an OV with me in the next 24 hours for this/these issue(s).  I discussed the assessment and treatment plan with the patient. The patient was provided an opportunity to ask questions and all were answered. The patient agreed with the plan and demonstrated an understanding of the instructions.   I spent 18 minutes on the date of this visit in the care of this patient. See summary of tasks completed to properly care for this patient in the detailed notes above which also included counseling of above, review of PMH, medications, allergies, evaluation of the patient and ordering and/or  instructing patient on testing and care options.     Terressa Koyanagi, DO

## 2020-10-13 ENCOUNTER — Other Ambulatory Visit: Payer: Self-pay | Admitting: Physician Assistant

## 2020-11-11 ENCOUNTER — Other Ambulatory Visit: Payer: Self-pay | Admitting: Family Medicine

## 2020-12-15 ENCOUNTER — Encounter: Payer: Self-pay | Admitting: Family

## 2020-12-15 ENCOUNTER — Telehealth (INDEPENDENT_AMBULATORY_CARE_PROVIDER_SITE_OTHER): Payer: BC Managed Care – PPO | Admitting: Family

## 2020-12-15 ENCOUNTER — Other Ambulatory Visit: Payer: Self-pay

## 2020-12-15 VITALS — Ht 68.0 in | Wt 216.1 lb

## 2020-12-15 DIAGNOSIS — R519 Headache, unspecified: Secondary | ICD-10-CM

## 2020-12-15 MED ORDER — IBUPROFEN 600 MG PO TABS: 600.0000 mg | ORAL_TABLET | Freq: Three times a day (TID) | ORAL | 0 refills | Status: DC | PRN

## 2020-12-15 NOTE — Assessment & Plan Note (Signed)
3 days of sx. Including nasal drainage and congestion, left sided facial pain, headache, OTC meds tried so far not helping. Advised to try generic sudafed or claritin D, also sending RX Ibuprofen.

## 2020-12-15 NOTE — Patient Instructions (Signed)
Switch to generic Sudafed or Claritin D and take as directed on box. Prescription Ibuprofen has been sent to your pharmacy to help with sinus pain, pressure, and headache. Call back in a few days if no improvement of symptoms.

## 2020-12-15 NOTE — Progress Notes (Signed)
MyChart Video Visit    Virtual Visit via Video Note   This visit type was conducted due to national recommendations for restrictions regarding the COVID-19 Pandemic (e.g. social distancing) in an effort to limit this patient's exposure and mitigate transmission in our community. This patient is at least at moderate risk for complications without adequate follow up. This format is felt to be most appropriate for this patient at this time. Physical exam was limited by quality of the video and audio technology used for the visit. CMA was able to get the patient set up on a video visit.  Patient location: Home. Patient and provider in visit Provider location: Office  I discussed the limitations of evaluation and management by telemedicine and the availability of in person appointments. The patient expressed understanding and agreed to proceed.  Visit Date: 12/15/2020  Today's healthcare provider: Dulce Sellar, NP     Subjective:    Patient ID: Joann Hebert, female    DOB: [birth year], 30 y.o.   MRN: [id number]  Chief Complaint  Patient presents with   Nasal Congestion   Headache    Last 3 days. She has taken Excedrin, and Tylenol severed sinus, but still has not subsided.   Facial Pain    Headache  Pt c/o sinus pain, nasal congestion, left sided facial pain going on for 3 days. Pt denies any cough, sore throat, fever, body aches or chills.     Outpatient Medications Prior to Visit  Medication Sig Dispense Refill   buPROPion (WELLBUTRIN XL) 300 MG 24 hr tablet TAKE 1 TABLET BY MOUTH EVERY DAY 90 tablet 0   clonazePAM (KLONOPIN) 0.5 MG tablet Take 1 tablet (0.5 mg total) by mouth 2 (two) times daily as needed for anxiety. 30 tablet 0   benzonatate (TESSALON PERLES) 100 MG capsule Take 1 capsule (100 mg total) by mouth 3 (three) times daily as needed. (Patient not taking: Reported on 12/15/2020) 20 capsule 0   cholecalciferol (VITAMIN D3) 25 MCG (1000 UNIT) tablet Take 1,000  Units by mouth daily. (Patient not taking: Reported on 12/15/2020)     amoxicillin-clavulanate (AUGMENTIN) 875-125 MG tablet Take 1 tablet by mouth 2 (two) times daily. (Patient not taking: Reported on 12/15/2020) 20 tablet 0   No facility-administered medications prior to visit.    No Known Allergies  ROS See HPI above     Objective:    Physical Exam Nursing note reviewed.  Neurological:     Mental Status: She is alert and oriented to person, place, and time.  Psychiatric:        Mood and Affect: Mood normal.        Behavior: Behavior normal.   Ht 5\' 8"  (1.727 m)   Wt 216 lb 0.8 oz (98 kg)   BMI 32.85 kg/m  Wt Readings from Last 3 Encounters:  12/15/20 216 lb 0.8 oz (98 kg)  05/04/20 216 lb (98 kg)  04/16/20 216 lb (98 kg)       Assessment & Plan:   Problem List Items Addressed This Visit       Other   Sinus headache - Primary    3 days of sx. Including nasal drainage and congestion, left sided facial pain, headache, OTC meds tried so far not helping. Advised to try generic sudafed or claritin D, also sending RX Ibuprofen.      Relevant Medications   ibuprofen (ADVIL) 600 MG tablet    I have discontinued Krystin Wenzler's amoxicillin-clavulanate. I am also  having her start on ibuprofen. Additionally, I am having her maintain her cholecalciferol, benzonatate, clonazePAM, and buPROPion.  Meds ordered this encounter  Medications   ibuprofen (ADVIL) 600 MG tablet    Sig: Take 1 tablet (600 mg total) by mouth every 8 (eight) hours as needed (for sinus pain, pressure, headache).    Dispense:  30 tablet    Refill:  0    Order Specific Question:   Supervising Provider    Answer:   ANDY, CAMILLE L [[id number]]    I discussed the assessment and treatment plan with the patient. The patient was provided an opportunity to ask questions and all were answered. The patient agreed with the plan and demonstrated an understanding of the instructions.   The patient was advised to call  back or seek an in-person evaluation if the symptoms worsen or if the condition fails to improve as anticipated.   Joann Sellar, NP [facility] [place] [phone] (phone) [phone] (fax)  [facility]

## 2021-02-24 ENCOUNTER — Telehealth: Payer: Self-pay

## 2021-02-24 NOTE — Telephone Encounter (Signed)
°  Encourage patient to contact the pharmacy for refills or they can request refills through [facility]  LAST APPOINTMENT DATE:  04/16/20  NEXT APPOINTMENT DATE: 03/09/21  MEDICATION: buPROPion (WELLBUTRIN XL) 300 MG 24 hr tablet  Is the patient out of medication? Yes   PHARMACY: [id number] - [place], [place] - [address]  Let patient know to contact pharmacy at the end of the day to make sure medication is ready.  Please notify patient to allow 48-72 hours to process

## 2021-02-24 NOTE — Telephone Encounter (Signed)
Please see note and advise  

## 2021-02-25 ENCOUNTER — Other Ambulatory Visit: Payer: Self-pay | Admitting: Physician Assistant

## 2021-02-25 MED ORDER — BUPROPION HCL ER (XL) 300 MG PO TB24: 300.0000 mg | ORAL_TABLET | Freq: Every day | ORAL | 0 refills | Status: DC

## 2021-02-25 NOTE — Telephone Encounter (Signed)
Joann Hebert, pt is scheduled to see you 03/09/2021.

## 2021-03-09 ENCOUNTER — Telehealth: Payer: BC Managed Care – PPO | Admitting: Physician Assistant

## 2021-03-09 ENCOUNTER — Other Ambulatory Visit: Payer: Self-pay

## 2021-03-09 ENCOUNTER — Encounter: Payer: Self-pay | Admitting: Physician Assistant

## 2021-03-09 VITALS — Ht 68.0 in | Wt 201.0 lb

## 2021-03-09 DIAGNOSIS — N926 Irregular menstruation, unspecified: Secondary | ICD-10-CM

## 2021-03-09 DIAGNOSIS — F419 Anxiety disorder, unspecified: Secondary | ICD-10-CM

## 2021-03-09 DIAGNOSIS — F320 Major depressive disorder, single episode, mild: Secondary | ICD-10-CM

## 2021-03-09 MED ORDER — BUSPIRONE HCL 10 MG PO TABS: 10.0000 mg | ORAL_TABLET | Freq: Three times a day (TID) | ORAL | 1 refills | Status: DC | PRN

## 2021-03-09 MED ORDER — CLONAZEPAM 0.5 MG PO TABS: 0.5000 mg | ORAL_TABLET | Freq: Two times a day (BID) | ORAL | 0 refills | Status: DC | PRN

## 2021-03-09 MED ORDER — BUPROPION HCL ER (XL) 300 MG PO TB24: 300.0000 mg | ORAL_TABLET | Freq: Every day | ORAL | 1 refills | Status: DC

## 2021-03-09 NOTE — Progress Notes (Signed)
I acted as a Neurosurgeon for [employer], PA-C Corky Mull, LPN   Virtual Visit via Video Note   I, Jarold Motto, connected with  Joann Hebert  ([id number], 1990/11/28) on 03/09/21 at  1:00 PM EST by a video-enabled telemedicine application and verified that I am speaking with the correct person using two identifiers.  Location: Patient: Virtual Visit Location Patient: Home Provider: Virtual Visit Location Provider: Office/Clinic   I discussed the limitations of evaluation and management by telemedicine and the availability of in person appointments. The patient expressed understanding and agreed to proceed.    History of Present Illness: Joann Hebert is a 30 y.o. who identifies as a female who was assigned female at birth, and is being seen today for   Anxiety/Depression Pt is following up, currently taking Wellbutrin XL 300 mg daily, Clonazepam 0.5 mg pt says she takes 1/2 tablet at a time and is using only once a week. Denies SI/HI. Overall managing well but is having some situational anxiety with upcoming job change.   Changes in periods She has noticed that her periods have been coming about a day early. Needs to see ob-gyn but hasn't been able to schedule this yet. Denies severe pain or heavy bleeding. Has been under a lot of stress the past few months. Does not track her periods.   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    Allergies: No Known Allergies Medications:  Current Outpatient Medications:    buPROPion (WELLBUTRIN XL) 300 MG 24 hr tablet, Take 1 tablet (300 mg total) by mouth daily., Disp: 30 tablet, Rfl: 0   clonazePAM (KLONOPIN) 0.5 MG tablet, Take 1 tablet (0.5 mg total) by mouth 2 (two) times daily as needed for anxiety., Disp: 30 tablet, Rfl: 0   ibuprofen (ADVIL) 600 MG  tablet, Take 1 tablet (600 mg total) by mouth every 8 (eight) hours as needed (for sinus pain, pressure, headache)., Disp: 30 tablet, Rfl: 0  Observations/Objective: Patient is well-developed, well-nourished in no acute distress.  Resting comfortably at home.  Head is normocephalic, atraumatic.  No labored breathing.  Speech is clear and coherent with logical content.  Patient is alert and oriented at baseline.   Assessment and Plan: Anxiety; Depression, major, single episode, mild (HCC) Overall controlled but does have some situational anxiety Continue wellbutrin 300 mg xl daily and klonopin 0.5 mg prn Trial 10 mg buspar tid prn for breakthrough anxiety I have asked her to send me a mychart message if this medication does not work well for her. May consider prn propranolol if needed. I discussed with patient that if they develop any SI, to tell someone immediately and seek medical attention. Follow-up in 6 months, sooner if concerns.  Abnormal menstrual periods No red flags Recommend tracking periods in app to further evaluate Recommend follow-up with ob-gyn or our office if new/worsening sx  Follow Up Instructions: I discussed the assessment and treatment plan with the patient. The patient was provided an opportunity to ask questions and all were answered. The patient agreed with the plan and demonstrated an understanding of the instructions.  A copy of instructions were sent to the patient via MyChart unless otherwise noted below.    The patient was advised to call back or seek an in-person evaluation if the symptoms worsen or if the  condition fails to improve as anticipated.   Jarold Motto, [place]

## 2021-06-15 ENCOUNTER — Ambulatory Visit: Payer: Managed Care, Other (non HMO) | Admitting: Family Medicine

## 2021-06-15 ENCOUNTER — Encounter: Payer: Self-pay | Admitting: Family Medicine

## 2021-06-15 VITALS — BP 112/80 | HR 74 | Temp 97.2°F | Ht 69.0 in | Wt 206.1 lb

## 2021-06-15 DIAGNOSIS — B309 Viral conjunctivitis, unspecified: Secondary | ICD-10-CM

## 2021-06-15 MED ORDER — POLYMYXIN B-TRIMETHOPRIM 10000-0.1 UNIT/ML-% OP SOLN: 1.0000 [drp] | OPHTHALMIC | 0 refills | Status: AC

## 2021-06-15 NOTE — Progress Notes (Signed)
? ?  Subjective:  ? ? ? Patient ID: Joann Hebert, female    DOB: 1990/06/22, 31 y.o.   MRN: [id number] ? ?Chief Complaint  ?Patient presents with  ? Conjunctivitis  ?  Started yesterday, used daughter's pink eye drops  ?Very red and draining on yesterday, left eye ?Feels irritated, was worse on yesterday  ? ? ?HPI ?Pink eye L-yesterday-very red and draining-used daughter's drops as she's had it for sev days and on tx. Some better today.  The "yellow ones".   Eye L was glued shut this am. ?No pain now but was painful yesterday, just irrit.  No URI. No f/c.    Some sneezing yesterday ?Taking allergy meds. ? ? ?Health Maintenance Due  ?Topic Date Due  ? PAP SMEAR-Modifier  08/30/2017  ? COVID-19 Vaccine (3 - Booster for Janssen series) 06/26/2020  ? ? ?Past Medical History:  ?Diagnosis Date  ? Anxiety   ? Depression   ? Hypertension   ? Migraines   ? has tried imitrex  ? ? ?Past Surgical History:  ?Procedure Laterality Date  ? WISDOM TOOTH EXTRACTION Bilateral 2009  ? ? ?Outpatient Medications Prior to Visit  ?Medication Sig Dispense Refill  ? buPROPion (WELLBUTRIN XL) 300 MG 24 hr tablet Take 1 tablet (300 mg total) by mouth daily. 90 tablet 1  ? busPIRone (BUSPAR) 10 MG tablet Take 1 tablet (10 mg total) by mouth 3 (three) times daily as needed. 30 tablet 1  ? clonazePAM (KLONOPIN) 0.5 MG tablet Take 1 tablet (0.5 mg total) by mouth 2 (two) times daily as needed for anxiety. 30 tablet 0  ? ibuprofen (ADVIL) 600 MG tablet Take 1 tablet (600 mg total) by mouth every 8 (eight) hours as needed (for sinus pain, pressure, headache). 30 tablet 0  ? ?No facility-administered medications prior to visit.  ? ? ?No Known Allergies ?ROS neg/noncontributory except as noted HPI/below ? ? ?   ?Objective:  ?  ? ?BP 112/80   Pulse 74   Temp (!) 97.2 ?F (36.2 ?C) (Temporal)   Ht 5\' 9"  (1.753 m)   Wt 206 lb 2 oz (93.5 kg)   LMP 06/11/2021 (Exact Date)   SpO2 97%   BMI 30.44 kg/m?  ?Wt Readings from Last 3 Encounters:  ?06/15/21 206  lb 2 oz (93.5 kg)  ?03/09/21 201 lb (91.2 kg)  ?12/15/20 216 lb 0.8 oz (98 kg)  ? ? ?Physical Exam  ? ?Gen: WDWN NAD ?HEENT: NCAT, sclera nonicteric.  EOMI, L conjuctiva injected ?TM WNL B, OP moist, no exudates  ?NECK:  supple, no thyromegaly, no nodes, no carotid bruits ?EXT:  no edema ?MSK: no gross abnormalities.  ?NEURO: A&O x3.  CN II-XII intact.  ?PSYCH: normal mood. Good eye contact ? ?   ?Assessment & Plan:  ? ?Problem List Items Addressed This Visit   ?None ?Visit Diagnoses   ? ? Acute viral conjunctivitis of left eye    -  Primary  ? ?  ? ? Conjunctivitis L-polytrim.  Warm/cool compresses ? ?No orders of the defined types were placed in this encounter. ? ? ?02/14/21, MD ? ?

## 2021-06-15 NOTE — Patient Instructions (Signed)
It was very nice to see you today! ? ?Drops sent to pharmacy.  Warm compresses.  Wash hands frequently.  ? ? ?PLEASE NOTE: ? ?If you had any lab tests please let us know if you have not heard back within a few days. You may see your results on MyChart before we have a chance to review them but we will give you a call once they are reviewed by [place]. If we ordered any referrals today, please let us know if you have not heard from their office within the next week.  ? ?Please try these tips to maintain a healthy lifestyle: ? ?Eat most of your calories during the day when you are active. Eliminate processed foods including packaged sweets (pies, cakes, cookies), reduce intake of potatoes, white bread, white pasta, and white rice. Look for whole grain options, oat flour or almond flour. ? ?Each meal should contain half fruits/vegetables, one quarter protein, and one quarter carbs (no bigger than a computer mouse). ? ?Cut down on sweet beverages. This includes juice, soda, and sweet tea. Also watch fruit intake, though this is a healthier sweet option, it still contains natural sugar! Limit to 3 servings daily. ? ?Drink at least 1 glass of water with each meal and aim for at least 8 glasses per day ? ?Exercise at least 150 minutes every week.   ?

## 2021-09-30 ENCOUNTER — Other Ambulatory Visit: Payer: Self-pay | Admitting: Physician Assistant

## 2021-12-05 ENCOUNTER — Telehealth: Payer: Self-pay | Admitting: Physician Assistant

## 2021-12-05 ENCOUNTER — Encounter: Payer: Self-pay | Admitting: *Deleted

## 2021-12-05 DIAGNOSIS — F419 Anxiety disorder, unspecified: Secondary | ICD-10-CM

## 2021-12-06 NOTE — Telephone Encounter (Signed)
Last refill: 03/09/21 #30 0 Last OV: 06/15/21 dx. Acute visit w/ Dr/ Cherlynn Kaiser

## 2021-12-06 NOTE — Telephone Encounter (Signed)
Noted  

## 2021-12-30 ENCOUNTER — Ambulatory Visit: Payer: Managed Care, Other (non HMO) | Admitting: Family Medicine

## 2021-12-30 ENCOUNTER — Encounter: Payer: Self-pay | Admitting: Family Medicine

## 2021-12-30 VITALS — BP 110/76 | HR 92 | Temp 97.9°F | Ht 69.0 in | Wt 217.2 lb

## 2021-12-30 DIAGNOSIS — J029 Acute pharyngitis, unspecified: Secondary | ICD-10-CM

## 2021-12-30 DIAGNOSIS — J020 Streptococcal pharyngitis: Secondary | ICD-10-CM | POA: Diagnosis not present

## 2021-12-30 LAB — POCT RAPID STREP A (OFFICE): Rapid Strep A Screen: POSITIVE — AB

## 2021-12-30 MED ORDER — PENICILLIN V POTASSIUM 500 MG PO TABS: 500.0000 mg | ORAL_TABLET | Freq: Three times a day (TID) | ORAL | 0 refills | Status: AC

## 2021-12-30 NOTE — Progress Notes (Signed)
   Subjective:     Patient ID: Joann Hebert, female    DOB: February 22, 1991, 32 y.o.   MRN: [id number]  Chief Complaint  Patient presents with   Ear Pain    Right ear pain that started last night when swallowing    Sore Throat    Started last night    HPI R ear pain since last pm when swallows.  Sore throat as well.  No f/c .  Sneezing a lot yesterday. mild runny nose.  Occ cough.  No v/d.  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  [employer] Due  Topic Date Due   PAP SMEAR-Modifier  08/30/2017    Past Medical History:  Diagnosis Date   Anxiety    Depression    Hypertension    Migraines    has tried imitrex    Past Surgical History:  Procedure Laterality Date   HEMANGIOMA EXCISION Right 06/25/2020   elbow   WISDOM TOOTH EXTRACTION Bilateral 2009    Outpatient Medications Prior to Visit  Medication Sig Dispense Refill   buPROPion (WELLBUTRIN XL) 300 MG 24 hr tablet TAKE 1 TABLET BY MOUTH EVERY DAY 90 tablet 1   busPIRone (BUSPAR) 10 MG tablet Take 1 tablet (10 mg total) by mouth 3 (three) times daily as needed. 30 tablet 1   clonazePAM (KLONOPIN) 0.5 MG tablet Take 1 tablet (0.5 mg total) by mouth 2 (two) times daily as needed for anxiety. 30 tablet 0   ibuprofen (ADVIL) 600 MG tablet Take 1 tablet (600 mg total) by mouth every 8 (eight) hours as needed (for sinus pain, pressure, headache). 30 tablet 0   No facility-administered medications prior to visit.    No Known Allergies ROS neg/noncontributory except as noted HPI/below      Objective:     BP 110/76   Pulse 92   Temp 97.9 F (36.6 C) (Temporal)   Ht 5\' 9"  (1.753 m)   Wt 217 lb 4 oz (98.5 kg)   LMP 12/23/2021 (Exact Date)   SpO2 98%   BMI 32.08 kg/m  Wt Readings from Last 3 Encounters:  12/30/21 217 lb 4 oz (98.5 kg)  06/15/21 206 lb 2 oz (93.5 kg)  03/09/21 201 lb (91.2 kg)    Physical Exam   Gen: WDWN NAD HEENT: NCAT, conjunctiva not injected, sclera nonicteric TM WNL B x scarring R  and poss small hole, OP moist, no exudates but very red NECK:  supple, no thyromegaly, no nodes, no carotid bruits CARDIAC: RRR, S1S2+, no murmur.  LUNGS: CTAB. No wheezes EXT:  no edema MSK: no gross abnormalities.  NEURO: A&O x3.  CN II-XII intact.  PSYCH: normal mood. Good eye contact  Results for orders placed or performed in visit on 12/30/21  POCT rapid strep A  Result Value Ref Range   Rapid Strep A Screen Positive (A) Negative        Assessment & Plan:   Problem List Items Addressed This Visit   None Visit Diagnoses     Strep pharyngitis    -  Primary   Sore throat       Relevant Orders   POCT rapid strep A (Completed)      Strep throat-penicliin 500mg  tid x 10d.     No orders of the defined types were placed in this encounter.   Wellington Hampshire, MD

## 2021-12-30 NOTE — Patient Instructions (Addendum)
Take all the penicillin Tylenol/motrin for pain.  Garggles.  Replace toothbrush in few days

## 2022-02-10 IMAGING — MR MR ELBOW*R* WO/W CM
6 of 9 series · 23 of 40 positions shown · IV contrast (multihance)
Comparison: Plain films right elbow 05/04/2020.

CLINICAL DATA: Mass lesion on the right elbow for 5 years.

EXAM:
MRI OF THE RIGHT ELBOW WITHOUT AND WITH CONTRAST
TECHNIQUE: Multiplanar, multisequence MR imaging of the elbow was performed
before and after the administration of intravenous contrast.
CONTRAST:  20 mL MULTIHANCE GADOBENATE DIMEGLUMINE 529 MG/ML IV SOLN

[Series 4: T1 · axial · 3.5mm · 0.29mm/px · z∈[-74,+75]mm · 5 of 33 slices shown]
[im 1/33]
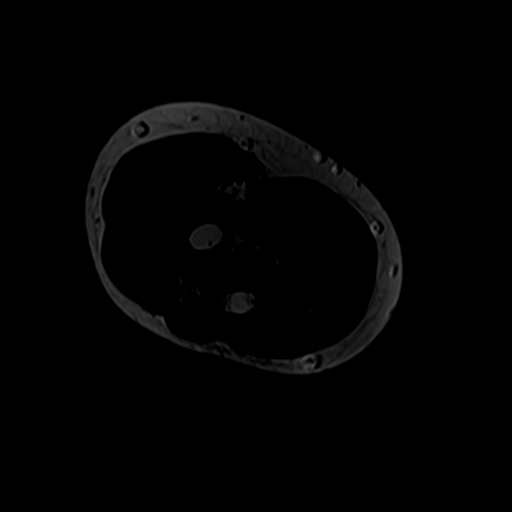
[im 9/33]
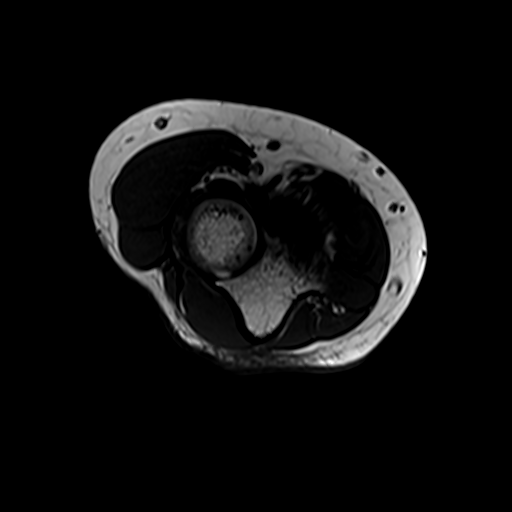
[im 17/33]
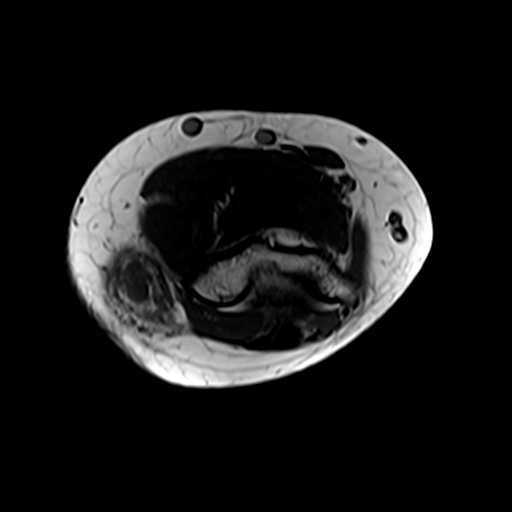
[im 25/33]
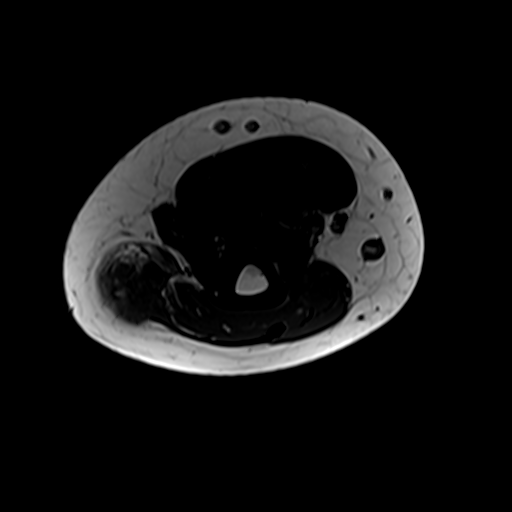
[im 33/33]
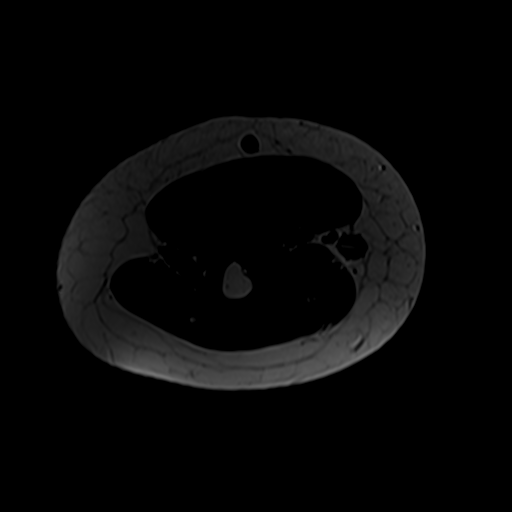

[Series 5: T2 fat-sat · axial · 3.5mm · 0.29mm/px · z∈[-74,+75]mm · 5 of 33 slices shown (1 of 3)]
[im 1/33]
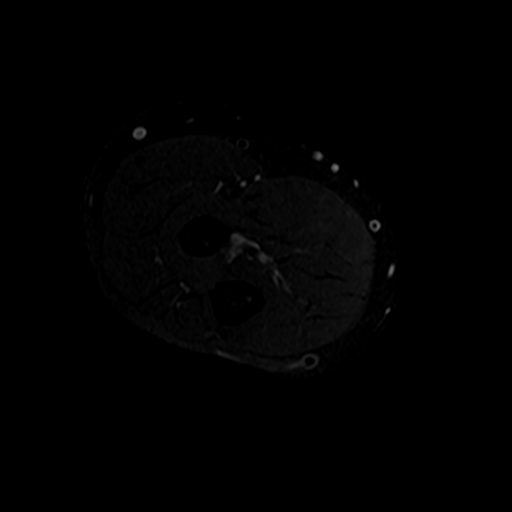
[im 9/33]
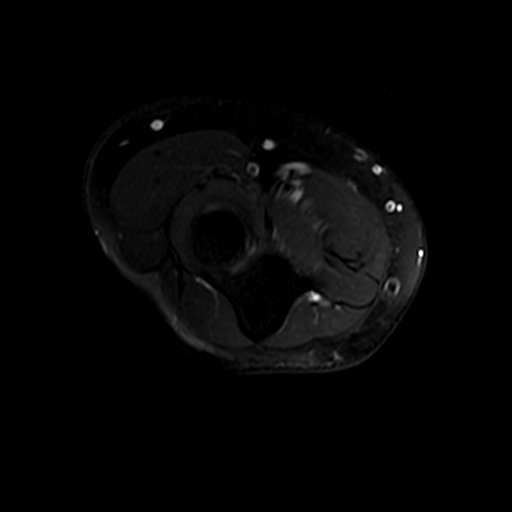
[im 17/33]
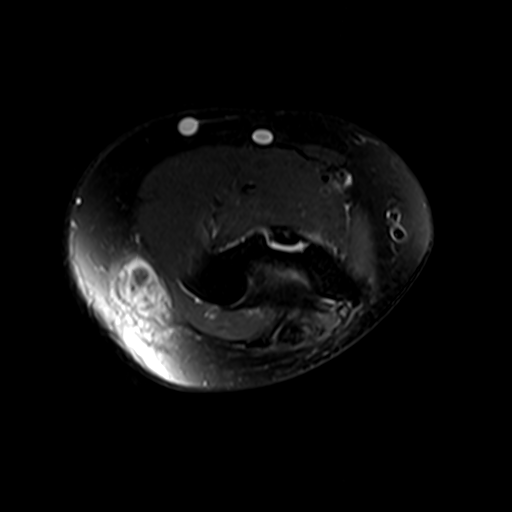
[im 25/33]
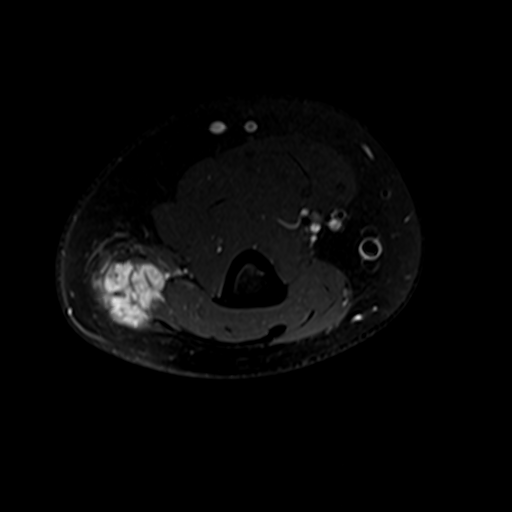
[im 33/33]
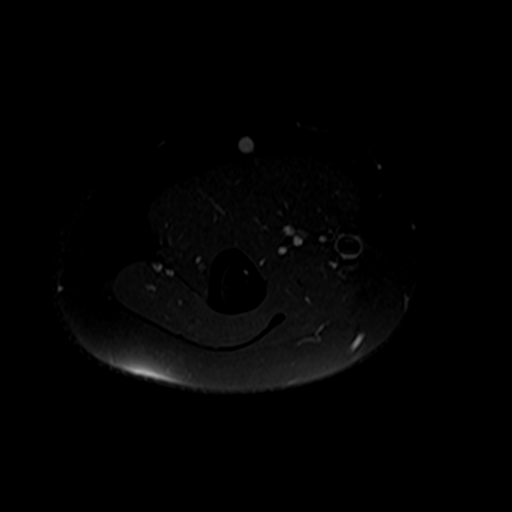

[Series 6: T1 fat-sat · coronal · 3.5mm · 0.59mm/px · 4 of 25 slices shown (1 of 2)]
[im 1/25]
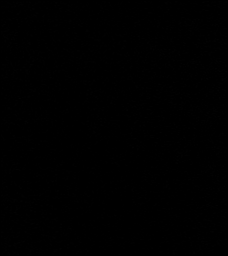
[im 9/25]
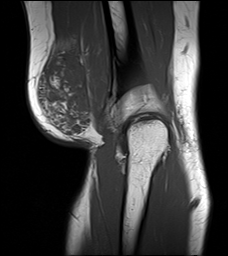
[im 17/25]
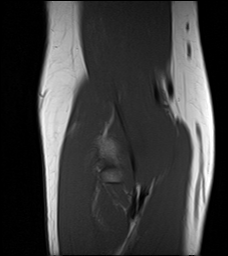
[im 25/25]
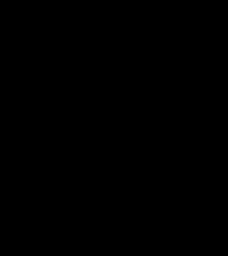

[Series 7: T2 fat-sat · coronal · 3.5mm · 0.29mm/px · 4 of 27 slices shown (2 of 3)]
[im 1/27]
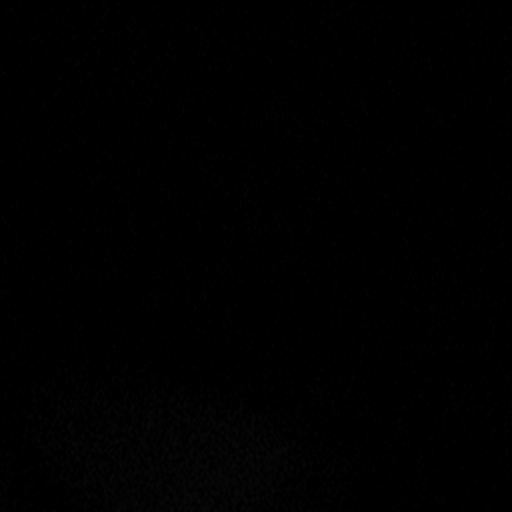
[im 9/27]
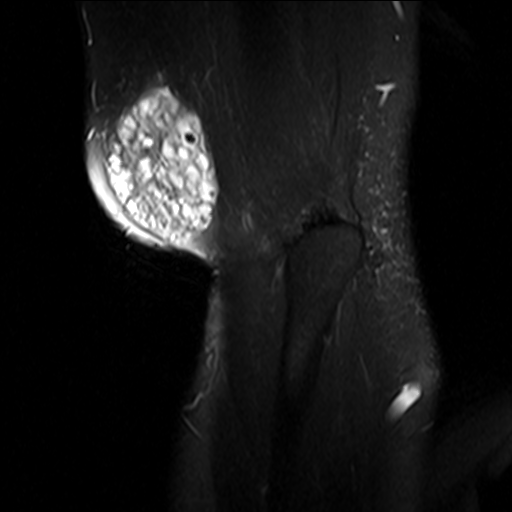
[im 18/27]
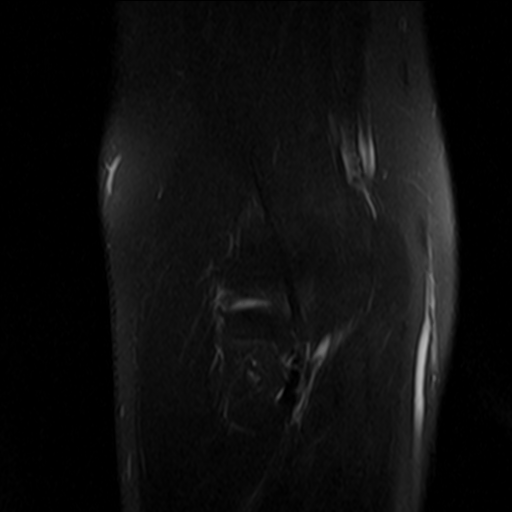
[im 27/27]
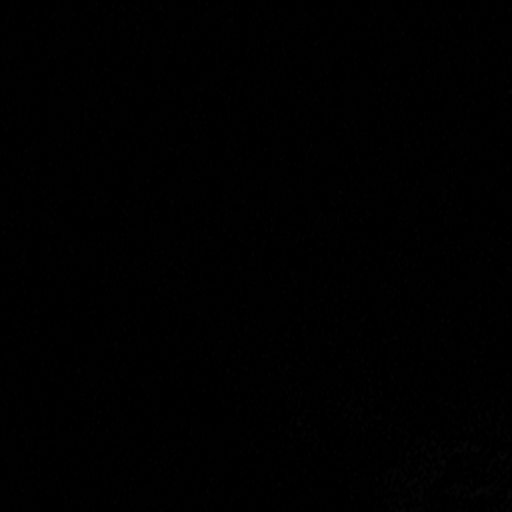

[Series 9: T1 fat-sat · axial · non-contrast · 3.5mm · 0.29mm/px · 1 of 32 slices shown (2 of 2)]
[im 1/32]
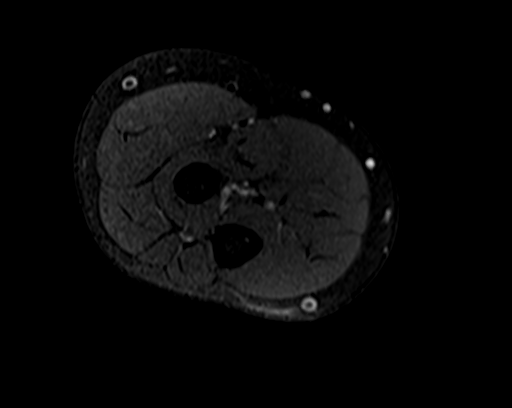

[Series 10: T2 fat-sat · sagittal · 3.5mm · 0.33mm/px · 4 of 30 slices shown (3 of 3)]
[im 1/30]
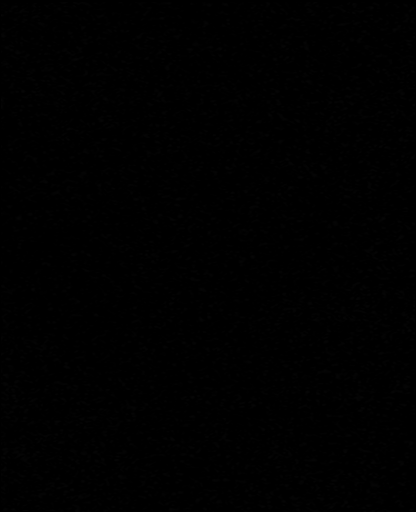
[im 10/30]
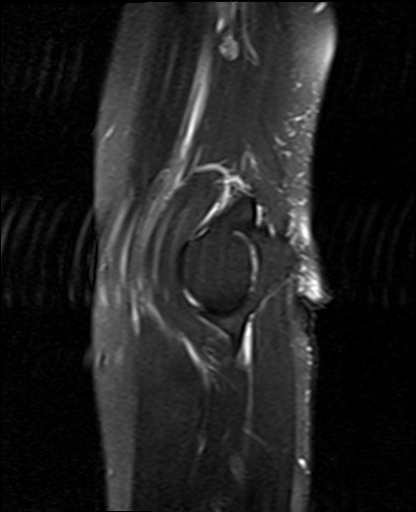
[im 20/30]
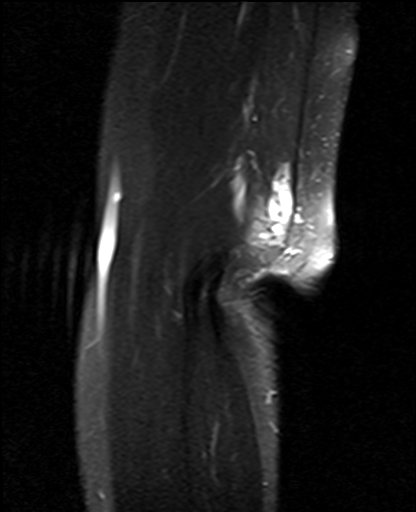
[im 30/30]
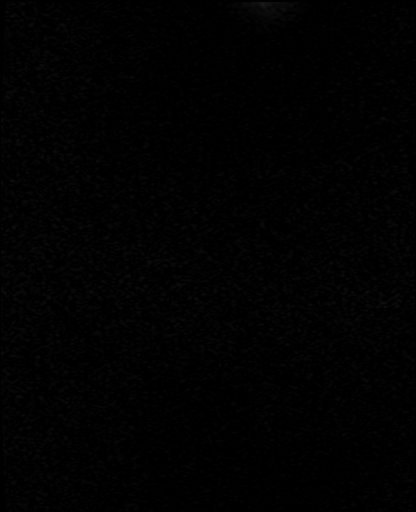

[23 of 40 positions shown; findings below may reference images not displayed]

FINDINGS: There is a mass lesion in the in lateral soft tissues of the distal
upper arm which measures approximately 5 cm craniocaudal by 4 cm AP
by 3 cm transverse. The lesion is predominantly T2 hyperintense with
areas increased signal within it on T1 weighted imaging. It
demonstrates avid, heterogeneous enhancement. The lesion abuts the
distal triceps muscle belly but does not invade it. Lesion also does
not extend to bone or into the elbow joint. Does not appear to
involve a major neurovascular bundle.

TENDONS

Common forearm flexor origin: Intact.

Common forearm extensor origin: Intact.

Biceps: Intact.

Triceps: Intact.

LIGAMENTS

Medial stabilizers: Intact.

Lateral stabilizers:  Intact.

Cartilage: Normal.

Joint: Normal.

Cubital tunnel: Normal.

Bones: Normal.
IMPRESSION: Mass lesion in the distal aspect of the upper arm has an appearance
highly worrisome for aggressive neoplasm. Differential
considerations include malignant peripheral nerve sheath tumor,
liposarcoma or pleomorphic undifferentiated sarcoma. As noted, the
lesion abuts the distal triceps muscle but does not invade the elbow
joint or bone. It does not involve a major neurovascular bundle.

## 2022-04-24 NOTE — Progress Notes (Shared)
Joann Hebert is a 32 y.o. female here for a follow up of a pre-existing problem.  History of Present Illness:   No chief complaint on file.   HPI  Anxiety and Depression   Hypertension   Migraines    Past Medical History:  Diagnosis Date   Anxiety    Depression    Hypertension    Migraines    has tried imitrex     Social History   Tobacco Use   Smoking status: Never   Smokeless tobacco: Never  Vaping Use   Vaping Use: Never used  Substance Use Topics   Alcohol use: Yes    Comment: occassionally   Drug use: Never    Past Surgical History:  Procedure Laterality Date   HEMANGIOMA EXCISION Right 06/25/2020   elbow   WISDOM TOOTH EXTRACTION Bilateral 2009    Family History  Problem Relation Age of Onset   Depression Father    Diabetes Father    Heart attack Father    Hyperlipidemia Father    Hypertension Father    Depression Brother    Alcohol abuse Maternal Grandmother    Osteoarthritis Maternal Grandmother    Diabetes Maternal Grandmother    Hyperlipidemia Maternal Grandmother    Hypertension Maternal Grandmother    Miscarriages / Stillbirths Maternal Grandmother    Diabetes Maternal Grandfather    Hyperlipidemia Maternal Grandfather    Hypertension Maternal Grandfather    Kidney disease Maternal Grandfather    Depression Paternal Grandfather    Heart attack Paternal Grandfather    Colon cancer Neg Hx    Breast cancer Neg Hx     No Known Allergies  Current Medications:   Current Outpatient Medications:    buPROPion (WELLBUTRIN XL) 300 MG 24 hr tablet, TAKE 1 TABLET BY MOUTH EVERY DAY, Disp: 90 tablet, Rfl: 1   busPIRone (BUSPAR) 10 MG tablet, Take 1 tablet (10 mg total) by mouth 3 (three) times daily as needed., Disp: 30 tablet, Rfl: 1   clonazePAM (KLONOPIN) 0.5 MG tablet, Take 1 tablet (0.5 mg total) by mouth 2 (two) times daily as needed for anxiety., Disp: 30 tablet, Rfl: 0   ibuprofen (ADVIL) 600 MG tablet, Take 1 tablet (600 mg total)  by mouth every 8 (eight) hours as needed (for sinus pain, pressure, headache)., Disp: 30 tablet, Rfl: 0   Review of Systems:   ROS  Vitals:   There were no vitals filed for this visit.   There is no height or weight on file to calculate BMI.  Physical Exam:   Physical Exam  Assessment and Plan:   ***   I,Alexander Ruley,acting as a scribe for Inda Coke, PA.,have documented all relevant documentation on the behalf of Inda Coke, PA,as directed by  Inda Coke, PA while in the presence of Inda Coke, [place].   ***   Inda Coke, PA-C

## 2022-04-26 ENCOUNTER — Ambulatory Visit: Payer: Managed Care, Other (non HMO) | Admitting: Physician Assistant

## 2022-04-26 ENCOUNTER — Encounter: Payer: Self-pay | Admitting: Physician Assistant

## 2022-04-26 VITALS — BP 120/76 | HR 67 | Temp 97.7°F | Ht 69.0 in | Wt 217.0 lb

## 2022-04-26 DIAGNOSIS — E669 Obesity, unspecified: Secondary | ICD-10-CM

## 2022-04-26 DIAGNOSIS — F320 Major depressive disorder, single episode, mild: Secondary | ICD-10-CM | POA: Diagnosis not present

## 2022-04-26 DIAGNOSIS — Z0000 Encounter for general adult medical examination without abnormal findings: Secondary | ICD-10-CM

## 2022-04-26 DIAGNOSIS — F419 Anxiety disorder, unspecified: Secondary | ICD-10-CM | POA: Diagnosis not present

## 2022-04-26 DIAGNOSIS — Z124 Encounter for screening for malignant neoplasm of cervix: Secondary | ICD-10-CM | POA: Diagnosis not present

## 2022-04-26 LAB — CBC WITH DIFFERENTIAL/PLATELET
Basophils Absolute: 0 10*3/uL (ref 0.0–0.1)
Basophils Relative: 0.2 % (ref 0.0–3.0)
Eosinophils Absolute: 0.1 10*3/uL (ref 0.0–0.7)
Eosinophils Relative: 2.1 % (ref 0.0–5.0)
HCT: 37.8 % (ref 36.0–46.0)
Hemoglobin: 12.7 g/dL (ref 12.0–15.0)
Lymphocytes Relative: 36.4 % (ref 12.0–46.0)
Lymphs Abs: 2.1 10*3/uL (ref 0.7–4.0)
MCHC: 33.6 g/dL (ref 30.0–36.0)
MCV: 86.5 fl (ref 78.0–100.0)
Monocytes Absolute: 0.4 10*3/uL (ref 0.1–1.0)
Monocytes Relative: 6.7 % (ref 3.0–12.0)
Neutro Abs: 3.2 10*3/uL (ref 1.4–7.7)
Neutrophils Relative %: 54.6 % (ref 43.0–77.0)
Platelets: 283 10*3/uL (ref 150.0–400.0)
RBC: 4.37 Mil/uL (ref 3.87–5.11)
RDW: 13.6 % (ref 11.5–15.5)
WBC: 5.8 10*3/uL (ref 4.0–10.5)

## 2022-04-26 LAB — LIPID PANEL
Cholesterol: 215 mg/dL — ABNORMAL HIGH (ref 0–200)
HDL: 34.8 mg/dL — ABNORMAL LOW (ref >39.00)
LDL Cholesterol: 150 mg/dL — ABNORMAL HIGH (ref 0–99)
NonHDL: 179.95
Total CHOL/HDL Ratio: 6
Triglycerides: 151 mg/dL — ABNORMAL HIGH (ref 0.0–149.0)
VLDL: 30.2 mg/dL (ref 0.0–40.0)

## 2022-04-26 LAB — COMPREHENSIVE METABOLIC PANEL
ALT: 11 U/L (ref 0–35)
AST: 12 U/L (ref 0–37)
Albumin: 4.4 g/dL (ref 3.5–5.2)
Alkaline Phosphatase: 47 U/L (ref 39–117)
BUN: 19 mg/dL (ref 6–23)
CO2: 23 mEq/L (ref 19–32)
Calcium: 9.5 mg/dL (ref 8.4–10.5)
Chloride: 107 mEq/L (ref 96–112)
Creatinine, Ser: 0.97 mg/dL (ref 0.40–1.20)
GFR: 76.83 mL/min (ref >60.00)
Glucose, Bld: 116 mg/dL — ABNORMAL HIGH (ref 70–99)
Potassium: 4.2 mEq/L (ref 3.5–5.1)
Sodium: 137 mEq/L (ref 135–145)
Total Bilirubin: 0.5 mg/dL (ref 0.2–1.2)
Total Protein: 7 g/dL (ref 6.0–8.3)

## 2022-04-26 MED ORDER — PHENTERMINE HCL 37.5 MG PO TABS: 37.5000 mg | ORAL_TABLET | Freq: Every day | ORAL | 0 refills | Status: DC

## 2022-04-26 MED ORDER — CLONAZEPAM 0.5 MG PO TABS: 0.5000 mg | ORAL_TABLET | Freq: Two times a day (BID) | ORAL | 0 refills | Status: DC | PRN

## 2022-04-26 MED ORDER — SERTRALINE HCL 25 MG PO TABS: 25.0000 mg | ORAL_TABLET | Freq: Every day | ORAL | 1 refills | Status: DC

## 2022-04-26 NOTE — Progress Notes (Signed)
Subjective:    Joann Hebert is a 32 y.o. female and is here for a comprehensive physical exam.  HPI  Health Maintenance Due  Topic Date Due   PAP SMEAR-Modifier  08/30/2017    Acute Concerns: None  Chronic Issues: Anxiety and Depression Requesitng refill for Klonopin 0.5 mg prn. She is taking this approximately monthly. She is compliant with Buspar 10 mg three times daily as needed, Wellbutrin XL 300 mg daily. Denies major concerns with the above medications but would like to try something new to better control symptoms. Has taken Zoloft in the past with no symptom relief. Interested in trying a combined Wellbutrin/Zoloft medication.  Obesity Going to the gym once weekly and spin class once weekly. Despite this she has not been able to lose weight. Wt Readings from Last 3 Encounters:  04/26/22 217 lb (98.4 kg)  12/30/21 217 lb 4 oz (98.5 kg)  06/15/21 206 lb 2 oz (93.5 kg)   She is interested in trying a medication to help with appetite control. Reluctant to trial GLP-1 RA's  She does not see a dermatologist regularly but denies any new/concerning moles. Denies swelling in ankles/legs, diarrhea, constipation.  Health Maintenance: Immunizations -- UTD Colonoscopy -- N/A Mammogram -- N/A PAP -- Last completed 08/31/14. Negative for intraepithelial lesion or malignancy. Recommended repeat in 2021. Requesting referral for new gynecologist. Bone Density -- N/A Diet -- Tries to eat well but struggling with urges to eat unhealthy foods. Exercise -- Goes to gym and spin class once weekly each.  Sleep habits -- Okay. Mood -- Okay.  UTD with dentist? - UTD UTD with eye doctor? - Reports that she will be doing an eye exam this year for the first time in years.  Weight history: Wt Readings from Last 10 Encounters:  04/26/22 217 lb (98.4 kg)  12/30/21 217 lb 4 oz (98.5 kg)  06/15/21 206 lb 2 oz (93.5 kg)  03/09/21 201 lb (91.2 kg)  12/15/20 216 lb 0.8 oz (98 kg)   05/04/20 216 lb (98 kg)  04/16/20 216 lb (98 kg)  10/13/19 216 lb (98 kg)  09/20/18 258 lb (117 kg)  08/19/18 235 lb (106.6 kg)   Body mass index is 32.05 kg/m. Patient's last menstrual period was 04/21/2022 (exact date).  Alcohol use:  reports current alcohol use.  Tobacco use:  Tobacco Use: Low Risk  (04/26/2022)   Patient History    Smoking Tobacco Use: Never    Smokeless Tobacco Use: Never    Passive Exposure: Not on file   Eligible for lung cancer screening? No     04/26/2022    8:10 AM  Depression screen PHQ 2/9  Decreased Interest 1  Down, Depressed, Hopeless 2  PHQ - 2 Score 3  Altered sleeping 2  Tired, decreased energy 2  Change in appetite 3  Feeling bad or failure about yourself  1  Trouble concentrating 1  Moving slowly or fidgety/restless 0  Suicidal thoughts 0  PHQ-9 Score 12     Other providers/specialists: Patient Care Team: Inda Coke, [place] as PCP - General (Physician Assistant)    PMHx, SurgHx, SocialHx, Medications, and Allergies were reviewed in the Visit Navigator and updated as appropriate.   Past Medical History:  Diagnosis Date   Anxiety    Depression    Hypertension    Migraines    has tried imitrex     Past Surgical History:  Procedure Laterality Date   HEMANGIOMA EXCISION Right 06/25/2020   elbow  WISDOM TOOTH EXTRACTION Bilateral 2009     Family History  Problem Relation Age of Onset   Depression Father    Diabetes Father    Heart attack Father    Hyperlipidemia Father    Hypertension Father    Depression Brother    Alcohol abuse Maternal Grandmother    Osteoarthritis Maternal Grandmother    Diabetes Maternal Grandmother    Hyperlipidemia Maternal Grandmother    Hypertension Maternal Grandmother    Miscarriages / Stillbirths Maternal Grandmother    Diabetes Maternal Grandfather    Hyperlipidemia Maternal Grandfather    Hypertension Maternal Grandfather    Kidney disease Maternal Grandfather     Depression Paternal Grandfather    Heart attack Paternal Grandfather    Colon cancer Neg Hx    Breast cancer Neg Hx     Social History   Tobacco Use   Smoking status: Never   Smokeless tobacco: Never  Vaping Use   Vaping Use: Never used  Substance Use Topics   Alcohol use: Yes    Comment: occassionally   Drug use: Never    Review of Systems:   Review of Systems  Constitutional:  Negative for chills, fever, malaise/fatigue and weight loss.  HENT:  Negative for hearing loss, sinus pain and sore throat.   Respiratory:  Negative for cough and hemoptysis.   Cardiovascular:  Negative for chest pain, palpitations, leg swelling and PND.  Gastrointestinal:  Negative for abdominal pain, constipation, diarrhea, heartburn, nausea and vomiting.  Genitourinary:  Negative for dysuria, frequency and urgency.  Musculoskeletal:  Negative for back pain, myalgias and neck pain.  Skin:  Negative for itching and rash.  Neurological:  Negative for dizziness, tingling, seizures and headaches.  Endo/Heme/Allergies:  Negative for polydipsia.  Psychiatric/Behavioral:  Negative for depression. The patient is not nervous/anxious.     Objective:   BP 120/76 (BP Location: Left Arm, Patient Position: Sitting, Cuff Size: Large)   Pulse 67   Temp 97.7 F (36.5 C) (Temporal)   Ht 5' 9"$  (1.753 m)   Wt 217 lb (98.4 kg)   LMP 04/21/2022 (Exact Date)   SpO2 96%   BMI 32.05 kg/m  Body mass index is 32.05 kg/m.   General Appearance:    Alert, cooperative, no distress, appears stated age  Head:    Normocephalic, without obvious abnormality, atraumatic  Eyes:    PERRL, conjunctiva/corneas clear, EOM's intact, fundi    benign, both eyes  Ears:    Normal TM's and external ear canals, both ears  Nose:   Nares normal, septum midline, mucosa normal, no drainage    or sinus tenderness  Throat:   Lips, mucosa, and tongue normal; teeth and gums normal  Neck:   Supple, symmetrical, trachea midline, no  adenopathy;    thyroid:  no enlargement/tenderness/nodules; no carotid   bruit or JVD  Back:     Symmetric, no curvature, ROM normal, no CVA tenderness  Lungs:     Clear to auscultation bilaterally, respirations unlabored  Chest Wall:    No tenderness or deformity   Heart:    Regular rate and rhythm, S1 and S2 normal, no murmur, rub or gallop  Breast Exam:    Deferred  Abdomen:     Soft, non-tender, bowel sounds active all four quadrants,    no masses, no organomegaly  Genitalia:    Deferred   Extremities:   Extremities normal, atraumatic, no cyanosis or edema  Pulses:   2+ and symmetric all extremities  Skin:  Skin color, texture, turgor normal, no rashes or lesions  Lymph nodes:   Cervical, supraclavicular, and axillary nodes normal  Neurologic:   CNII-XII intact, normal strength, sensation and reflexes    throughout    Assessment/Plan:   Routine physical examination Today patient counseled on age appropriate routine health concerns for screening and prevention, each reviewed and up to date or declined. Immunizations reviewed and up to date or declined. Labs ordered and reviewed. Risk factors for depression reviewed and negative. Hearing function and visual acuity are intact. ADLs screened and addressed as needed. Functional ability and level of safety reviewed and appropriate. Education, counseling and referrals performed based on assessed risks today. Patient provided with a copy of personalized plan for preventive services.  Depression, major, single episode, mild ([place]); Anxiety Uncontrolled Continue Wellbutrin 300 mg daily Add Zoloft 25 mg daily Follow-up in 1 month via mychart, sooner if concerns I discussed with patient that if they develop any SI, to tell someone immediately and seek medical attention.  Pap smear for cervical cancer screening Referral placed  Obesity, unspecified classification, unspecified obesity type, unspecified whether serious comorbidity  present Continue diet and exercise as able She is agreeable to trial phentermine -- recommend 1/2 a dose -- low threshold to stop this if contributing to anxiety/irritability  I,Alexander Ruley,acting as a scribe for [employer], PA.,have documented all relevant documentation on the behalf of Inda Coke, PA,as directed by  Inda Coke, PA while in the presence of Inda Coke, [place].  I, Inda Coke, [place], have reviewed all documentation for this visit. The documentation on 04/26/22 for the exam, diagnosis, procedures, and orders are all accurate and complete.'  Inda Coke, PA-C [place]

## 2022-04-26 NOTE — Patient Instructions (Addendum)
It was great to see you!  Continue Wellbutrin 300 mg XL daily Add Zoloft 25 mg daily Send me a mychart message in 4 weeks to let me know if you like the zoloft, sooner if concerns of course  If you develop suicidal thoughts, please tell someone and immediately proceed to our local 24/7 crisis center, [facility] at the [facility]. [address], [place],  [place] [address] [phone].   Trial 1/2 tablet of Phentermine -- if this makes your symptoms worse, please stop!  Please go to the lab for blood work.   Our office will call you with your results unless you have chosen to receive results via MyChart.  If your blood work is normal we will follow-up each year for physicals and as scheduled for chronic medical problems.  If anything is abnormal we will treat accordingly and get you in for a follow-up.  Take care,  Aldona Bar

## 2022-05-24 ENCOUNTER — Ambulatory Visit: Payer: Managed Care, Other (non HMO) | Admitting: Radiology

## 2022-05-24 ENCOUNTER — Other Ambulatory Visit (HOSPITAL_COMMUNITY)
Admission: RE | Admit: 2022-05-24 | Discharge: 2022-05-24 | Disposition: A | Discharge status: Home / Self Care | Payer: Managed Care, Other (non HMO) | Source: Ambulatory Visit | Attending: Radiology | Admitting: Radiology

## 2022-05-24 ENCOUNTER — Encounter: Payer: Self-pay | Admitting: Radiology

## 2022-05-24 VITALS — BP 122/78 | Ht 67.5 in | Wt 203.0 lb

## 2022-05-24 DIAGNOSIS — Z01419 Encounter for gynecological examination (general) (routine) without abnormal findings: Secondary | ICD-10-CM | POA: Insufficient documentation

## 2022-05-24 NOTE — Progress Notes (Signed)
   Joann Hebert 11-27-1990 [id number]   History:  32 y.o. G1P1 here for Aex as a new patient. Hx of abnormal paps, was getting paps every 6 months until 2016, has had no f/u since. No gyn concerns. Has a PCP  Gynecologic History Patient's last menstrual period was 05/18/2022 (exact date). Period Cycle (Days): 28 Period Duration (Days): 4 Period Pattern: Regular Menstrual Flow: Heavy, Light (1 heavy day, then lighter) Menstrual Control: Tampon Dysmenorrhea: (!) Moderate Dysmenorrhea Symptoms: Cramping Contraception/Family planning: abstinence and rhythm method Sexually active: yes- last 10/23 Last Pap: 2016. Results were: abnormal   Obstetric History OB History  Gravida Para Term Preterm AB Living  1 1   1   1   SAB IAB Ectopic Multiple Live Births        0 1    # Outcome Date GA Lbr Len/2nd Weight Sex Delivery Anes PTL Lv  1 Preterm 09/21/18 [redacted]w[redacted]d 18:31 / 01:12 8 lb 0.9 oz (3.654 kg) F Vag-Spont EPI  LIV     The following portions of the patient's history were reviewed and updated as appropriate: allergies, current medications, past family history, past medical history, past social history, past surgical history, and problem list.  Review of Systems Pertinent items noted in HPI and remainder of comprehensive ROS otherwise negative.   Past medical history, past surgical history, family history and social history were all reviewed and documented in the EPIC chart.   Exam:  Vitals:   05/24/22 0949  BP: 122/78  Weight: 203 lb (92.1 kg)  Height: 5' 7.5" (1.715 m)   Body mass index is 31.33 kg/m.  General appearance:  Normal Thyroid:  Symmetrical, normal in size, without palpable masses or nodularity. Respiratory  Auscultation:  Clear without wheezing or rhonchi Cardiovascular  Auscultation:  Regular rate, without rubs, murmurs or gallops  Edema/varicosities:  Not grossly evident Abdominal  Soft,nontender, without masses, guarding or rebound.  Liver/spleen:  No  organomegaly noted  Hernia:  None appreciated  Skin  Inspection:  Grossly normal Breasts: Examined lying and sitting.   Right: Without masses, retractions, nipple discharge or axillary adenopathy.   Left: Without masses, retractions, nipple discharge or axillary adenopathy. Genitourinary   Inguinal/mons:  Normal without inguinal adenopathy  External genitalia:  Normal appearing vulva with no masses, tenderness, or lesions  BUS/Urethra/Skene's glands:  Normal without masses or exudate  Vagina:  Normal appearing with normal color and discharge, no lesions  Cervix:  Normal appearing without discharge or lesions  Uterus:  Normal in size, shape and contour.  Mobile, nontender  Adnexa/parametria:     Rt: Normal in size, without masses or tenderness.   Lt: Normal in size, without masses or tenderness.  Anus and perineum: Normal   Patient informed chaperone available to be present for breast and pelvic exam. Patient has requested no chaperone to be present. Patient has been advised what will be completed during breast and pelvic exam.   Assessment/Plan:   1. Well woman exam with routine gynecological exam - Declines STI screening - Hx of abnormal paps - Declines pregnancy prevention - Cytology - PAP( [place])     Discussed SBE, colonoscopy and DEXA screening as directed/appropriate. Recommend 142mins of exercise weekly, including weight bearing exercise. Encouraged the use of seatbelts and sunscreen. Return in 1 year for annual or as needed.   Rubbie Battiest B WHNP-BC 10:06 AM 05/24/2022

## 2022-05-26 LAB — CYTOLOGY - PAP
Comment: NEGATIVE
Diagnosis: NEGATIVE
High risk HPV: NEGATIVE

## 2022-05-27 ENCOUNTER — Other Ambulatory Visit: Payer: Self-pay | Admitting: Physician Assistant

## 2022-06-16 ENCOUNTER — Other Ambulatory Visit: Payer: Self-pay | Admitting: Physician Assistant

## 2022-06-16 MED ORDER — PHENTERMINE HCL 37.5 MG PO TABS: 37.5000 mg | ORAL_TABLET | Freq: Every day | ORAL | 0 refills | Status: DC

## 2022-06-16 NOTE — Telephone Encounter (Signed)
Last refill: 04/26/22 #30, 0 Last OV: 04/26/22 dx. CPE

## 2022-06-16 NOTE — Telephone Encounter (Signed)
Pt need a refill on this med. She was told by pharmacy to call our office. Please advise.

## 2022-07-13 ENCOUNTER — Other Ambulatory Visit: Payer: Self-pay | Admitting: Physician Assistant

## 2022-07-13 MED ORDER — PHENTERMINE HCL 37.5 MG PO TABS: 37.5000 mg | ORAL_TABLET | Freq: Every day | ORAL | 0 refills | Status: DC

## 2022-07-13 NOTE — Telephone Encounter (Signed)
Last refill: 06/16/22 #30, 0 Last OV: 04/26/22 dx. CPE

## 2022-07-18 NOTE — Telephone Encounter (Signed)
Prescription Request  07/18/2022  LOV: 04/26/2022  What is the name of the medication or equipment?  busPIRone (BUSPAR) 10 MG tablet   Have you contacted your pharmacy to request a refill? Yes   Which pharmacy would you like this sent to?  [id number] [place], [place] - [address] [place] [address] Phone: [phone] Fax: [phone]    Patient notified that their request is being sent to the clinical staff for review and that they should receive a response within 2 business days.   Please advise at Mobile [phone] (mobile)

## 2022-08-24 ENCOUNTER — Other Ambulatory Visit: Payer: Self-pay | Admitting: Physician Assistant

## 2022-08-24 DIAGNOSIS — F419 Anxiety disorder, unspecified: Secondary | ICD-10-CM

## 2022-08-24 MED ORDER — CLONAZEPAM 0.5 MG PO TABS: 0.5000 mg | ORAL_TABLET | Freq: Two times a day (BID) | ORAL | 0 refills | Status: DC | PRN

## 2022-08-26 ENCOUNTER — Encounter: Payer: Self-pay | Admitting: Physician Assistant

## 2022-08-28 NOTE — Telephone Encounter (Signed)
Please see pt msg and advise 

## 2022-09-04 ENCOUNTER — Encounter: Payer: Self-pay | Admitting: Physician Assistant

## 2022-09-04 ENCOUNTER — Ambulatory Visit: Payer: Managed Care, Other (non HMO) | Admitting: Physician Assistant

## 2022-09-04 VITALS — BP 120/80 | HR 75 | Temp 97.1°F | Ht 67.5 in | Wt 193.2 lb

## 2022-09-04 DIAGNOSIS — E663 Overweight: Secondary | ICD-10-CM

## 2022-09-04 DIAGNOSIS — F320 Major depressive disorder, single episode, mild: Secondary | ICD-10-CM | POA: Diagnosis not present

## 2022-09-04 DIAGNOSIS — F419 Anxiety disorder, unspecified: Secondary | ICD-10-CM

## 2022-09-04 MED ORDER — PHENTERMINE HCL 37.5 MG PO TABS: 37.5000 mg | ORAL_TABLET | Freq: Every day | ORAL | 2 refills | Status: DC

## 2022-09-04 MED ORDER — BUSPIRONE HCL 10 MG PO TABS: 10.0000 mg | ORAL_TABLET | Freq: Three times a day (TID) | ORAL | 1 refills | Status: DC | PRN

## 2022-09-04 NOTE — Progress Notes (Signed)
Joann Hebert is a 32 y.o. female here for a follow up of a pre-existing problem.  History of Present Illness:   Chief Complaint  Patient presents with   Obesity    HPI  Overweight Managed with 37.5 mg Phentermine. Tolerating Phentermine well. She is still going to cycling classes once a week. After dinner she takes a 20 minute walk. Reports 20 pounds lost since starting Phentermine. Does not report constipation outside her normal. Does not report worsening anxiety.   Anxiety/Depression Managed with 300 mg Wellbutrin, 25 mg Zoloft, and 0.5 mg Klonopin. Tolerates Wellbutrin, Zoloft, and Klonopin well. She would like to establish talk therapy, is researching [facility], or open to recommendations. Denies SI/HI   Past Medical History:  Diagnosis Date   Anxiety    Depression    Hypertension    Migraines    has tried imitrex     Social History   Tobacco Use   Smoking status: Never    Passive exposure: Never   Smokeless tobacco: Never  Vaping Use   Vaping Use: Never used  Substance Use Topics   Alcohol use: Yes    Comment: occassionally   Drug use: Never    Past Surgical History:  Procedure Laterality Date   HEMANGIOMA EXCISION Right 06/25/2020   elbow   WISDOM TOOTH EXTRACTION Bilateral 2009    Family History  Problem Relation Age of Onset   Depression Father    Diabetes Father    Heart attack Father    Hyperlipidemia Father    Hypertension Father    Depression Brother    Alcohol abuse Maternal Grandmother    Osteoarthritis Maternal Grandmother    Diabetes Maternal Grandmother    Hyperlipidemia Maternal Grandmother    Hypertension Maternal Grandmother    Miscarriages / Stillbirths Maternal Grandmother    Diabetes Maternal Grandfather    Hyperlipidemia Maternal Grandfather    Hypertension Maternal Grandfather    Kidney disease Maternal Grandfather    Depression Paternal Grandfather    Heart attack Paternal Grandfather    Colon cancer Neg Hx     Breast cancer Neg Hx     No Known Allergies  Current Medications:   Current Outpatient Medications:    buPROPion (WELLBUTRIN XL) 300 MG 24 hr tablet, TAKE 1 TABLET BY MOUTH EVERY DAY, Disp: 90 tablet, Rfl: 0   clonazePAM (KLONOPIN) 0.5 MG tablet, Take 1 tablet (0.5 mg total) by mouth 2 (two) times daily as needed for anxiety., Disp: 30 tablet, Rfl: 0   sertraline (ZOLOFT) 25 MG tablet, Take 1 tablet (25 mg total) by mouth daily., Disp: 90 tablet, Rfl: 1   busPIRone (BUSPAR) 10 MG tablet, Take 1 tablet (10 mg total) by mouth 3 (three) times daily as needed., Disp: 30 tablet, Rfl: 1   phentermine (ADIPEX-P) 37.5 MG tablet, Take 1 tablet (37.5 mg total) by mouth daily before breakfast., Disp: 30 tablet, Rfl: 2   Review of Systems:   ROS Negative unless otherwise specified per HPI.  Vitals:   Vitals:   09/04/22 0832  BP: 120/80  Pulse: 75  Temp: (!) 97.1 F (36.2 C)  TempSrc: Temporal  SpO2: 98%  Weight: 193 lb 4 oz (87.7 kg)  Height: 5' 7.5" (1.715 m)     Body mass index is 29.82 kg/m.  Physical Exam:   Physical Exam Vitals and nursing note reviewed.  Constitutional:      General: She is not in acute distress.    Appearance: She is well-developed. She is not  ill-appearing or toxic-appearing.  Cardiovascular:     Rate and Rhythm: Normal rate and regular rhythm.     Pulses: Normal pulses.     Heart sounds: Normal heart sounds, S1 normal and S2 normal.  Pulmonary:     Effort: Pulmonary effort is normal.     Breath sounds: Normal breath sounds.  Skin:    General: Skin is warm and dry.  Neurological:     Mental Status: She is alert.     GCS: GCS eye subscore is 4. GCS verbal subscore is 5. GCS motor subscore is 6.  Psychiatric:        Speech: Speech normal.        Behavior: Behavior normal. Behavior is cooperative.     Assessment and Plan:   Anxiety; Depression Overall well controlled, doing better with additional of Zoloft Continue 300 mg Wellbutrin, 25 mg  Zoloft, and 0.5 mg Klonopin. Denies SI/HI Follow-up in 3-6 month(s), sooner if concerns  Overweight Improving No side effect(s) with phentermine Will refill and have her follow-up in 3 month(s), sooner if concerns   I,Emily Lagle,acting as a scribe for [employer], PA.,have documented all relevant documentation on the behalf of Jarold Motto, PA,as directed by  Jarold Motto, PA while in the presence of Jarold Motto, [place].  I, Jarold Motto, [place], have reviewed all documentation for this visit. The documentation on 09/04/22 for the exam, diagnosis, procedures, and orders are all accurate and complete.   Jarold Motto, PA-C

## 2022-09-14 ENCOUNTER — Other Ambulatory Visit: Payer: Self-pay | Admitting: Physician Assistant

## 2022-10-05 ENCOUNTER — Other Ambulatory Visit: Payer: Self-pay | Admitting: Physician Assistant

## 2022-10-30 ENCOUNTER — Telehealth: Payer: Managed Care, Other (non HMO) | Admitting: Family

## 2022-10-30 VITALS — Ht 67.5 in

## 2022-10-30 DIAGNOSIS — G43801 Other migraine, not intractable, with status migrainosus: Secondary | ICD-10-CM

## 2022-10-30 MED ORDER — CYCLOBENZAPRINE HCL 5 MG PO TABS
5.0000 mg | ORAL_TABLET | Freq: Three times a day (TID) | ORAL | 0 refills | Status: DC | PRN
Start: 2022-10-30 — End: 2024-01-15

## 2022-10-30 NOTE — Progress Notes (Signed)
MyChart Video Visit    Virtual Visit via Video Note   This format is felt to be most appropriate for this patient at this time. Physical exam was limited by quality of the video and audio technology used for the visit. CMA was able to get the patient set up on a video visit.  Patient location: Home. Patient and provider in visit Provider location: Office  I discussed the limitations of evaluation and management by telemedicine and the availability of in person appointments. The patient expressed understanding and agreed to proceed.  Visit Date: 10/30/2022  Today's healthcare provider: Dulce Sellar, NP     Subjective:   Patient ID: Joann Hebert, female    DOB: 1990/05/21, 32 y.o.   MRN: [id number]  Chief Complaint  Patient presents with   Migraine    sx for 4d    HPI Migraine:  Pt c/o migraines for about a week. Has tried ibuprofen and acetaminophen which did not help. Pt states she was nauseous with the migraines on Thursday.  Used to have migraines in HS but not really since then. Denies any sinus sx or sinus pressure. Denies any past HTN, [facility] and [facility] her [facility] which was in normal range. Denies excess caffeine.   Assessment & Plan:  Other migraine with status migrainosus, not intractable sending generic Flexeril, advised on use & SE, can take with generic Aleve 1-2 tabs bid or alternate with the Flexeril. Advised on proper daily hydration, not skipping meals, no extra caffeine. Call back or message if pain still not resolving.  -     Cyclobenzaprine HCl; Take 1-2 tablets (5-10 mg total) by mouth 3 (three) times daily as needed (For migraine pain.).  Dispense: 30 tablet; Refill: 0   Past Medical History:  Diagnosis Date   Anxiety    Depression    Hypertension    Migraines    has tried imitrex    Past Surgical History:  Procedure Laterality Date   HEMANGIOMA EXCISION Right 06/25/2020   elbow   WISDOM TOOTH EXTRACTION Bilateral 2009     Outpatient Medications Prior to Visit  Medication Sig Dispense Refill   buPROPion (WELLBUTRIN XL) 300 MG 24 hr tablet TAKE 1 TABLET BY MOUTH EVERY DAY 90 tablet 0   busPIRone (BUSPAR) 10 MG tablet Take 1 tablet (10 mg total) by mouth 3 (three) times daily as needed. 30 tablet 1   clonazePAM (KLONOPIN) 0.5 MG tablet Take 1 tablet (0.5 mg total) by mouth 2 (two) times daily as needed for anxiety. 30 tablet 0   phentermine (ADIPEX-P) 37.5 MG tablet Take 1 tablet (37.5 mg total) by mouth daily before breakfast. 30 tablet 2   sertraline (ZOLOFT) 25 MG tablet TAKE 1 TABLET (25 MG TOTAL) BY MOUTH DAILY. 90 tablet 1   No facility-administered medications prior to visit.    No Known Allergies     Objective:   Physical Exam Vitals and nursing note reviewed.  Constitutional:      General: Pt is not in acute distress.    Appearance: Normal appearance.  HENT:     Head: Normocephalic.  Pulmonary:     Effort: No respiratory distress.  Musculoskeletal:     Cervical back: Normal range of motion.  Skin:    General: Skin is dry.     Coloration: Skin is not pale.  Neurological:     Mental Status: Pt is alert and oriented to person, place, and time.  Psychiatric:  Mood and Affect: Mood normal.   Ht 5' 7.5" (1.715 m)   BMI 29.82 kg/m   Wt Readings from Last 3 Encounters:  09/04/22 193 lb 4 oz (87.7 kg)  05/24/22 203 lb (92.1 kg)  04/26/22 217 lb (98.4 kg)       I discussed the assessment and treatment plan with the patient. The patient was provided an opportunity to ask questions and all were answered. The patient agreed with the plan and demonstrated an understanding of the instructions.   The patient was advised to call back or seek an in-person evaluation if the symptoms worsen or if the condition fails to improve as anticipated.  Dulce Sellar, NP [facility] [phone] (phone) [phone] (fax)  [facility]

## 2022-12-04 ENCOUNTER — Other Ambulatory Visit: Payer: Self-pay | Admitting: Physician Assistant

## 2023-01-14 ENCOUNTER — Ambulatory Visit
Admission: RE | Admit: 2023-01-14 | Discharge: 2023-01-14 | Disposition: A | Discharge status: Home / Self Care | Payer: Managed Care, Other (non HMO) | Source: Ambulatory Visit | Attending: Internal Medicine | Admitting: Internal Medicine

## 2023-01-14 VITALS — BP 113/82 | HR 97 | Temp 98.5°F | Resp 19

## 2023-01-14 DIAGNOSIS — B349 Viral infection, unspecified: Secondary | ICD-10-CM | POA: Diagnosis not present

## 2023-01-14 LAB — POC COVID19/FLU A&B COMBO
Covid Antigen, POC: NEGATIVE
Influenza A Antigen, POC: NEGATIVE
Influenza B Antigen, POC: NEGATIVE

## 2023-01-14 NOTE — ED Provider Notes (Addendum)
[facility]    CSN: [id number] Arrival date & time: 01/14/23  1325      History   Chief Complaint Chief Complaint  Patient presents with   Cough    Bad cough, very sore throat, body aches - Entered by patient   Sore Throat   Generalized Body Aches    HPI Joann Hebert is a 32 y.o. female.   The history is provided by the patient.  Cough Associated symptoms: chills, headaches and sore throat   Associated symptoms: no chest pain, no fever, no rhinorrhea and no shortness of breath   Sore Throat Associated symptoms include headaches. Pertinent negatives include no chest pain and no shortness of breath.  Sick 2 days symptoms include cough, sore throat, body aches has had chills but no documented fever.  Denies rhinorrhea, nasal congestion, chest pain, shortness of breath, nausea, vomiting, diarrhea.  Household contacts have been ill, has been tested negative yesterday for COVID and flu.  She works in a childcare center admits exposures at work.  Past Medical History:  Diagnosis Date   Anxiety    Depression    Hypertension    Migraines    has tried imitrex    Patient Active Problem List   Diagnosis Date Noted   Gestational HTN 09/21/2018   Hyperlipidemia 12/20/2017   Obesity 12/19/2017   Anxiety 12/19/2017   Depression, major, single episode, mild (HCC) 12/19/2017    Past Surgical History:  Procedure Laterality Date   HEMANGIOMA EXCISION Right 06/25/2020   elbow   WISDOM TOOTH EXTRACTION Bilateral 2009    OB History     Gravida  1   Para  1   Term      Preterm  1   AB      Living  1      SAB      IAB      Ectopic      Multiple  0   Live Births  1            Home Medications    Prior to Admission medications   Medication Sig Start Date End Date Taking? Authorizing Provider  buPROPion (WELLBUTRIN XL) 300 MG 24 hr tablet TAKE 1 TABLET BY MOUTH EVERY DAY 12/05/22  Yes Jarold Motto, PA  phentermine (ADIPEX-P) 37.5 MG tablet  Take 1 tablet (37.5 mg total) by mouth daily before breakfast. 09/04/22  Yes Jarold Motto, PA  sertraline (ZOLOFT) 25 MG tablet TAKE 1 TABLET (25 MG TOTAL) BY MOUTH DAILY. 10/06/22  Yes Jarold Motto, PA  busPIRone (BUSPAR) 10 MG tablet Take 1 tablet (10 mg total) by mouth 3 (three) times daily as needed. 09/04/22   Jarold Motto, PA  clonazePAM (KLONOPIN) 0.5 MG tablet Take 1 tablet (0.5 mg total) by mouth 2 (two) times daily as needed for anxiety. 08/24/22   Jarold Motto, PA  cyclobenzaprine (FLEXERIL) 5 MG tablet Take 1-2 tablets (5-10 mg total) by mouth 3 (three) times daily as needed (For migraine pain.). 10/30/22   Dulce Sellar, NP    Family History Family History  Problem Relation Age of Onset   Depression Father    Diabetes Father    Heart attack Father    Hyperlipidemia Father    Hypertension Father    Depression Brother    Alcohol abuse Maternal Grandmother    Osteoarthritis Maternal Grandmother    Diabetes Maternal Grandmother    Hyperlipidemia Maternal Grandmother    Hypertension Maternal Grandmother    Miscarriages /  Stillbirths Maternal Grandmother    Diabetes Maternal Grandfather    Hyperlipidemia Maternal Grandfather    Hypertension Maternal Grandfather    Kidney disease Maternal Grandfather    Depression Paternal Grandfather    Heart attack Paternal Grandfather    Colon cancer Neg Hx    Breast cancer Neg Hx     Social History Social History   Tobacco Use   Smoking status: Never    Passive exposure: Never   Smokeless tobacco: Never  Vaping Use   Vaping status: Never Used  Substance Use Topics   Alcohol use: Yes    Comment: occassionally   Drug use: Never     Allergies   Patient has no known allergies.   Review of Systems Review of Systems  Constitutional:  Positive for chills and fatigue. Negative for fever.  HENT:  Positive for sore throat. Negative for congestion, rhinorrhea and sinus pain.   Respiratory:  Positive for cough.  Negative for shortness of breath.   Cardiovascular:  Negative for chest pain.  Gastrointestinal:  Negative for diarrhea, nausea and vomiting.  Neurological:  Positive for headaches. Negative for dizziness.     Physical Exam Triage Vital Signs ED Triage Vitals  Encounter Vitals Group     BP 01/14/23 1334 113/82     Systolic BP Percentile --      Diastolic BP Percentile --      Pulse Rate 01/14/23 1334 97     Resp 01/14/23 1334 19     Temp 01/14/23 1334 98.5 F (36.9 C)     Temp Source 01/14/23 1334 Oral     SpO2 01/14/23 1334 98 %     Weight --      Height --      Head Circumference --      Peak Flow --      Pain Score 01/14/23 1332 6     Pain Loc --      Pain Education --      Exclude from Growth Chart --    No data found.  Updated Vital Signs BP 113/82 (BP Location: Left Arm)   Pulse 97   Temp 98.5 F (36.9 C) (Oral)   Resp 19   LMP 01/12/2023 (Exact Date)   SpO2 98%   Visual Acuity Right Eye Distance:   Left Eye Distance:   Bilateral Distance:    Right Eye Near:   Left Eye Near:    Bilateral Near:     Physical Exam Constitutional:      Appearance: She is not ill-appearing or toxic-appearing.  HENT:     Head: Normocephalic and atraumatic.     Right Ear: Tympanic membrane and ear canal normal.     Left Ear: Tympanic membrane and ear canal normal.     Nose: No rhinorrhea.     Mouth/Throat:     Mouth: Mucous membranes are moist.     Pharynx: Oropharyngeal exudate present. No pharyngeal swelling, posterior oropharyngeal erythema or uvula swelling.     Tonsils: No tonsillar exudate or tonsillar abscesses.  Eyes:     Conjunctiva/sclera: Conjunctivae normal.  Cardiovascular:     Rate and Rhythm: Normal rate and regular rhythm.  Pulmonary:     Effort: Pulmonary effort is normal.     Breath sounds: Normal breath sounds.  Musculoskeletal:     Cervical back: Neck supple.  Lymphadenopathy:     Cervical: No cervical adenopathy.  Neurological:     Mental  Status: She is alert and oriented  to person, place, and time.      UC Treatments / Results  Labs (all labs ordered are listed, but only abnormal results are displayed) Labs Reviewed  POC COVID19/FLU A&B COMBO    EKG   Radiology No results found.  Procedures Procedures (including critical care time)  Medications Ordered in UC Medications - No data to display  Initial Impression / Assessment and Plan / UC Course  I have reviewed the triage vital signs and the nursing notes.  Pertinent labs & imaging results that were available during my care of the patient were reviewed by me and considered in my medical decision making (see chart for details).     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  Multiple family contacts with illness.  She is well-appearing, afebrile and nontoxic.  Normal respiratory rate heart rate and oxygen saturation.  Will check point-of-care COVID and flu Point-of-care COVID and flu are negative DC meds for symptoms, follow-up as needed  Final Clinical Impressions(s) / UC Diagnoses   Final diagnoses:  Viral syndrome     Discharge Instructions      Over the counter medicines for symptoms, follow-up as needed     ED Prescriptions   None    PDMP not reviewed this encounter.   Meliton Rattan, PA 01/14/23 1404    Meliton Rattan, [place] 01/14/23 [phone]

## 2023-01-14 NOTE — Discharge Instructions (Addendum)
Over the counter medicines for symptoms, follow-up as needed

## 2023-01-14 NOTE — ED Triage Notes (Signed)
  Bad cough, very sore throat, body aches  Since Friday. No fever. Dayquil-nyquil and mucinex.

## 2023-01-30 ENCOUNTER — Other Ambulatory Visit: Payer: Self-pay | Admitting: Physician Assistant

## 2023-01-30 ENCOUNTER — Telehealth: Payer: Self-pay | Admitting: Physician Assistant

## 2023-01-30 MED ORDER — SERTRALINE HCL 25 MG PO TABS: 25.0000 mg | ORAL_TABLET | Freq: Every day | ORAL | 0 refills | Status: DC

## 2023-01-30 NOTE — Telephone Encounter (Signed)
Prescription Request  01/30/2023  LOV: 09/04/2022  What is the name of the medication or equipment?  sertraline (ZOLOFT) 25 MG tablet   Have you contacted your pharmacy to request a refill? Yes   Which pharmacy would you like this sent to?  [id number], [place] - [address] [place] [address] Phone: [phone] Fax: [phone]    Patient notified that their request is being sent to the clinical staff for review and that they should receive a response within 2 business days.   Please advise at Mobile [phone] (mobile)

## 2023-01-30 NOTE — Telephone Encounter (Signed)
[facility]

## 2023-02-02 NOTE — Telephone Encounter (Signed)
Patient has been scheduled for 02/12/23 @ 10 am. States will be out of town all next week in [place]. Can enough medication be sent until 12/2 OV?

## 2023-02-12 ENCOUNTER — Ambulatory Visit: Payer: Managed Care, Other (non HMO) | Admitting: Physician Assistant

## 2023-02-12 ENCOUNTER — Encounter: Payer: Self-pay | Admitting: Physician Assistant

## 2023-02-12 VITALS — BP 110/80 | HR 66 | Temp 97.3°F | Ht 67.5 in | Wt 198.0 lb

## 2023-02-12 DIAGNOSIS — F320 Major depressive disorder, single episode, mild: Secondary | ICD-10-CM | POA: Diagnosis not present

## 2023-02-12 DIAGNOSIS — E669 Obesity, unspecified: Secondary | ICD-10-CM | POA: Diagnosis not present

## 2023-02-12 DIAGNOSIS — F419 Anxiety disorder, unspecified: Secondary | ICD-10-CM

## 2023-02-12 DIAGNOSIS — Z23 Encounter for immunization: Secondary | ICD-10-CM | POA: Diagnosis not present

## 2023-02-12 DIAGNOSIS — Z6830 Body mass index (BMI) 30.0-30.9, adult: Secondary | ICD-10-CM | POA: Diagnosis not present

## 2023-02-12 MED ORDER — ZEPBOUND 2.5 MG/0.5ML ~~LOC~~ SOAJ
2.5000 mg | SUBCUTANEOUS | 0 refills | Status: DC
Start: 2023-02-12 — End: 2023-06-08

## 2023-02-12 MED ORDER — CLONAZEPAM 0.5 MG PO TABS
0.5000 mg | ORAL_TABLET | Freq: Two times a day (BID) | ORAL | 0 refills | Status: DC | PRN
Start: 2023-02-12 — End: 2023-06-08

## 2023-02-12 MED ORDER — PHENTERMINE HCL 37.5 MG PO TABS: 37.5000 mg | ORAL_TABLET | Freq: Every day | ORAL | 0 refills | Status: DC

## 2023-02-12 MED ORDER — SERTRALINE HCL 50 MG PO TABS: 50.0000 mg | ORAL_TABLET | Freq: Every day | ORAL | 1 refills | Status: DC

## 2023-02-12 NOTE — Patient Instructions (Signed)
It was great to see you!  Increase Zoloft to 50 mg daily Continue Wellbutrin 300 mg xl daily  I will send in Zepbound for you -- we will be in touch if this is approved  Let's follow-up in 1-3 months, sooner if you have concerns.  Take care,  Jarold Motto PA-C

## 2023-02-12 NOTE — Progress Notes (Signed)
Joann Hebert is a 32 y.o. female here for a follow-up of a pre-existing problem.  History of Present Illness:   Chief Complaint  Patient presents with   Anxiety/Depression    Pt is feeling better.   Obesity    Pt would like a refill for Phentermine   HPI  Anxiety/Depression: Managed with 300 mg Wellbutrin daily, 25 mg Zoloft daily, 10 mg Buspar PRN, and 0.5 mg Klonopin PRN nightly. Reports that she has been feeling stressed lately and having to take her Buspar more frequently.  Notes that she usually is more depressed during Winter.  Denies SI/HI.  Obesity Managed with 37.5 mg Phentermine. Reports that she had stopped her Phentermine due to decreased efficacy and having contracted an illness before she was due for a refill.  Expressed interest in restarting or an alternate medication. She is exercising regularly, goes walking, goes to spin class weekly, limits total calories  Has gained 5 pounds since last seen in-office.  Wt Readings from Last 3 Encounters:  02/12/23 198 lb (89.8 kg)  09/04/22 193 lb 4 oz (87.7 kg)  05/24/22 203 lb (92.1 kg)   Past Medical History:  Diagnosis Date   Anxiety    Depression    Hypertension    Migraines    has tried imitrex    Social History   Tobacco Use   Smoking status: Never    Passive exposure: Never   Smokeless tobacco: Never  Vaping Use   Vaping status: Never Used  Substance Use Topics   Alcohol use: Yes    Comment: occassionally   Drug use: Never   Past Surgical History:  Procedure Laterality Date   HEMANGIOMA EXCISION Right 06/25/2020   elbow   WISDOM TOOTH EXTRACTION Bilateral 2009   Family History  Problem Relation Age of Onset   Depression Father    Diabetes Father    Heart attack Father    Hyperlipidemia Father    Hypertension Father    Depression Brother    Alcohol abuse Maternal Grandmother    Osteoarthritis Maternal Grandmother    Diabetes Maternal Grandmother    Hyperlipidemia Maternal Grandmother     Hypertension Maternal Grandmother    Miscarriages / Stillbirths Maternal Grandmother    Diabetes Maternal Grandfather    Hyperlipidemia Maternal Grandfather    Hypertension Maternal Grandfather    Kidney disease Maternal Grandfather    Depression Paternal Grandfather    Heart attack Paternal Grandfather    Colon cancer Neg Hx    Breast cancer Neg Hx    No Known Allergies Current Medications:   Current Outpatient Medications:    buPROPion (WELLBUTRIN XL) 300 MG 24 hr tablet, TAKE 1 TABLET BY MOUTH EVERY DAY, Disp: 90 tablet, Rfl: 0   busPIRone (BUSPAR) 10 MG tablet, Take 1 tablet (10 mg total) by mouth 3 (three) times daily as needed., Disp: 30 tablet, Rfl: 1   cyclobenzaprine (FLEXERIL) 5 MG tablet, Take 1-2 tablets (5-10 mg total) by mouth 3 (three) times daily as needed (For migraine pain.)., Disp: 30 tablet, Rfl: 0   tirzepatide (ZEPBOUND) 2.5 MG/0.5ML Pen, Inject 2.5 mg into the skin once a week., Disp: 2 mL, Rfl: 0   clonazePAM (KLONOPIN) 0.5 MG tablet, Take 1 tablet (0.5 mg total) by mouth 2 (two) times daily as needed for anxiety., Disp: 30 tablet, Rfl: 0   phentermine (ADIPEX-P) 37.5 MG tablet, Take 1 tablet (37.5 mg total) by mouth daily before breakfast., Disp: 30 tablet, Rfl: 0   sertraline (ZOLOFT)  50 MG tablet, Take 1 tablet (50 mg total) by mouth daily., Disp: 90 tablet, Rfl: 1  Review of Systems:   ROS See pertinent positives and negatives as per the HPI.  Vitals:   Vitals:   02/12/23 1009  BP: 110/80  Pulse: 66  Temp: (!) 97.3 F (36.3 C)  TempSrc: Temporal  SpO2: 98%  Weight: 198 lb (89.8 kg)  Height: 5' 7.5" (1.715 m)     Body mass index is 30.55 kg/m.  Physical Exam:   Physical Exam Vitals and nursing note reviewed.  Constitutional:      General: She is not in acute distress.    Appearance: She is well-developed. She is not ill-appearing or toxic-appearing.  Cardiovascular:     Rate and Rhythm: Normal rate and regular rhythm.     Pulses: Normal  pulses.     Heart sounds: Normal heart sounds, S1 normal and S2 normal.  Pulmonary:     Effort: Pulmonary effort is normal.     Breath sounds: Normal breath sounds.  Skin:    General: Skin is warm and dry.  Neurological:     Mental Status: She is alert.     GCS: GCS eye subscore is 4. GCS verbal subscore is 5. GCS motor subscore is 6.  Psychiatric:        Speech: Speech normal.        Behavior: Behavior normal. Behavior is cooperative.     Assessment and Plan:   Anxiety; Depression, major, single episode, mild (HCC) Uncontrolled Continue Wellbutrin 300 mg daily and increase Zoloft to 50 mg daily Follow-up 1 to 3 months to check in on medications, sooner if concerns I discussed with patient that if they develop any SI, to tell someone immediately and seek medical attention.  Obesity, unspecified class, unspecified obesity type, unspecified whether serious comorbidity present We discussed medication options today She is agreeable to trying a GLP-1, will trial Zepbound Discussed risks, benefits, side effects to medication Discussed that this medication is not safe for pregnancy I will send in 1 month of phentermine in the meantime while we try to get this medication approved Follow-up in 1 to 3 months, sooner if concerns  Need for immunization against influenza Completed today  I,Emily Lagle,acting as a scribe for [employer], PA.,have documented all relevant documentation on the behalf of Jarold Motto, PA,as directed by  Jarold Motto, PA while in the presence of Jarold Motto, [place].  I, Jarold Motto, [place], have reviewed all documentation for this visit. The documentation on 02/12/23 for the exam, diagnosis, procedures, and orders are all accurate and complete.  Jarold Motto, PA-C

## 2023-03-02 ENCOUNTER — Telehealth (INDEPENDENT_AMBULATORY_CARE_PROVIDER_SITE_OTHER): Payer: Managed Care, Other (non HMO) | Admitting: Family

## 2023-03-02 ENCOUNTER — Encounter: Payer: Self-pay | Admitting: Family

## 2023-03-02 DIAGNOSIS — J0110 Acute frontal sinusitis, unspecified: Secondary | ICD-10-CM

## 2023-03-02 MED ORDER — AMOXICILLIN-POT CLAVULANATE 875-125 MG PO TABS: 1.0000 | ORAL_TABLET | Freq: Two times a day (BID) | ORAL | 0 refills | Status: DC

## 2023-03-02 NOTE — Progress Notes (Signed)
MyChart Video Visit    Virtual Visit via Video Note   This format is felt to be most appropriate for this patient at this time. Physical exam was limited by quality of the video and audio technology used for the visit. CMA was able to get the patient set up on a video visit.  Patient location: Home. Patient and provider in visit Provider location: Office  I discussed the limitations of evaluation and management by telemedicine and the availability of in person appointments. The patient expressed understanding and agreed to proceed.  Visit Date: 03/02/2023  Today's healthcare provider: Dulce Sellar, NP     Subjective:   Patient ID: Joann Hebert, female    DOB: 05/04/1990, 32 y.o.   MRN: [id number]  Chief Complaint  Patient presents with   Sinus Problem    sx for 8 days    HPI: Sinus sx:  Pt c/o head/facial pressure, Nasal green drainage, Dry cough and bilateral ear pain/fullness. SX present for last Thursday, Has tried nyquil/dayquil yesterday, which did help sx and saline flush.    Assessment & Plan:  Acute non-recurrent frontal sinusitis - Sending Augmentin, advised on use & SE. Recommend taking generic Sudafed, Claritin D, or Mucinex D vs. Dayquil, per box directions, Ibuprofen up to 600mg  or generic Tylenol 1,000mg  every 6 hours. Use nasal saline spray tid, humidifier overnight. Increase water intake to at least 2 liters qd.    -     Amoxicillin-Pot Clavulanate; Take 1 tablet by mouth 2 (two) times daily after a meal.  Dispense: 10 tablet; Refill: 0  Past Medical History:  Diagnosis Date   Anxiety    Depression    Hypertension    Migraines    has tried imitrex    Past Surgical History:  Procedure Laterality Date   HEMANGIOMA EXCISION Right 06/25/2020   elbow   WISDOM TOOTH EXTRACTION Bilateral 2009    Outpatient Medications Prior to Visit  Medication Sig Dispense Refill   buPROPion (WELLBUTRIN XL) 300 MG 24 hr tablet TAKE 1 TABLET BY MOUTH EVERY  DAY 90 tablet 0   busPIRone (BUSPAR) 10 MG tablet Take 1 tablet (10 mg total) by mouth 3 (three) times daily as needed. 30 tablet 1   clonazePAM (KLONOPIN) 0.5 MG tablet Take 1 tablet (0.5 mg total) by mouth 2 (two) times daily as needed for anxiety. 30 tablet 0   cyclobenzaprine (FLEXERIL) 5 MG tablet Take 1-2 tablets (5-10 mg total) by mouth 3 (three) times daily as needed (For migraine pain.). 30 tablet 0   phentermine (ADIPEX-P) 37.5 MG tablet Take 1 tablet (37.5 mg total) by mouth daily before breakfast. 30 tablet 0   sertraline (ZOLOFT) 50 MG tablet Take 1 tablet (50 mg total) by mouth daily. 90 tablet 1   tirzepatide (ZEPBOUND) 2.5 MG/0.5ML Pen Inject 2.5 mg into the skin once a week. 2 mL 0   No facility-administered medications prior to visit.    No Known Allergies     Objective:   Physical Exam Vitals and nursing note reviewed.  Constitutional:      General: Pt is not in acute distress.    Appearance: Normal appearance.  HENT:     Head: Normocephalic.  Pulmonary:     Effort: No respiratory distress.  Musculoskeletal:     Cervical back: Normal range of motion.  Skin:    General: Skin is dry.     Coloration: Skin is not pale.  Neurological:     Mental Status:  Pt is alert and oriented to person, place, and time.  Psychiatric:        Mood and Affect: Mood normal.   LMP 02/05/2023 (Exact Date)   Wt Readings from Last 3 Encounters:  02/12/23 198 lb (89.8 kg)  09/04/22 193 lb 4 oz (87.7 kg)  05/24/22 203 lb (92.1 kg)      I discussed the assessment and treatment plan with the patient. The patient was provided an opportunity to ask questions and all were answered. The patient agreed with the plan and demonstrated an understanding of the instructions.   The patient was advised to call back or seek an in-person evaluation if the symptoms worsen or if the condition fails to improve as anticipated.  Dulce Sellar, NP [facility] [phone] (phone) [phone] (fax)  [facility]

## 2023-03-20 ENCOUNTER — Other Ambulatory Visit: Payer: Self-pay | Admitting: Physician Assistant

## 2023-03-20 ENCOUNTER — Encounter: Payer: Self-pay | Admitting: Physician Assistant

## 2023-03-20 ENCOUNTER — Telehealth: Payer: Self-pay | Admitting: *Deleted

## 2023-03-20 MED ORDER — PHENTERMINE HCL 37.5 MG PO TABS: 37.5000 mg | ORAL_TABLET | Freq: Every day | ORAL | 0 refills | Status: DC

## 2023-03-20 NOTE — Telephone Encounter (Signed)
 PA needed for Zepbound 2.5 mg

## 2023-03-21 ENCOUNTER — Other Ambulatory Visit (HOSPITAL_COMMUNITY): Payer: Self-pay

## 2023-03-21 ENCOUNTER — Telehealth: Payer: Self-pay

## 2023-03-21 NOTE — Telephone Encounter (Signed)
 Pharmacy Patient Advocate Encounter   Received notification from Pt Calls Messages that prior authorization for Zepbound  2.5mg /0.23ml is required/requested.   Insurance verification completed.   The patient is insured through [employer] .   Per test claim: Drug is not covered by plan. This medication is excluded from the patient's benefit.

## 2023-03-21 NOTE — Telephone Encounter (Signed)
 Joann Hebert, Zepbound is denied, plan exclusion.

## 2023-03-22 NOTE — Telephone Encounter (Signed)
 Left message on voicemail to call office. If patient calls back please tell her Zepbound has been denied by insurance due to plan exclusion, does not cover weight loss medications.

## 2023-04-26 ENCOUNTER — Other Ambulatory Visit: Payer: Self-pay | Admitting: Physician Assistant

## 2023-05-23 ENCOUNTER — Other Ambulatory Visit: Payer: Self-pay | Admitting: Physician Assistant

## 2023-06-08 ENCOUNTER — Ambulatory Visit (INDEPENDENT_AMBULATORY_CARE_PROVIDER_SITE_OTHER): Admitting: Physician Assistant

## 2023-06-08 ENCOUNTER — Encounter: Payer: Self-pay | Admitting: Physician Assistant

## 2023-06-08 VITALS — BP 110/80 | HR 65 | Temp 97.0°F | Ht 68.0 in | Wt 192.5 lb

## 2023-06-08 DIAGNOSIS — R7309 Other abnormal glucose: Secondary | ICD-10-CM

## 2023-06-08 DIAGNOSIS — E785 Hyperlipidemia, unspecified: Secondary | ICD-10-CM

## 2023-06-08 DIAGNOSIS — E669 Obesity, unspecified: Secondary | ICD-10-CM

## 2023-06-08 DIAGNOSIS — F320 Major depressive disorder, single episode, mild: Secondary | ICD-10-CM | POA: Diagnosis not present

## 2023-06-08 DIAGNOSIS — G43801 Other migraine, not intractable, with status migrainosus: Secondary | ICD-10-CM

## 2023-06-08 DIAGNOSIS — Z0000 Encounter for general adult medical examination without abnormal findings: Secondary | ICD-10-CM

## 2023-06-08 DIAGNOSIS — F419 Anxiety disorder, unspecified: Secondary | ICD-10-CM

## 2023-06-08 LAB — LIPID PANEL
Cholesterol: 211 mg/dL — ABNORMAL HIGH (ref 0–200)
HDL: 44 mg/dL (ref >39.00)
LDL Cholesterol: 141 mg/dL — ABNORMAL HIGH (ref 0–99)
NonHDL: 166.94
Total CHOL/HDL Ratio: 5
Triglycerides: 132 mg/dL (ref 0.0–149.0)
VLDL: 26.4 mg/dL (ref 0.0–40.0)

## 2023-06-08 LAB — CBC WITH DIFFERENTIAL/PLATELET
Basophils Absolute: 0 10*3/uL (ref 0.0–0.1)
Basophils Relative: 0.6 % (ref 0.0–3.0)
Eosinophils Absolute: 0.2 10*3/uL (ref 0.0–0.7)
Eosinophils Relative: 4.6 % (ref 0.0–5.0)
HCT: 39.6 % (ref 36.0–46.0)
Hemoglobin: 13.1 g/dL (ref 12.0–15.0)
Lymphocytes Relative: 39.1 % (ref 12.0–46.0)
Lymphs Abs: 1.9 10*3/uL (ref 0.7–4.0)
MCHC: 33.2 g/dL (ref 30.0–36.0)
MCV: 87.9 fl (ref 78.0–100.0)
Monocytes Absolute: 0.4 10*3/uL (ref 0.1–1.0)
Monocytes Relative: 7.3 % (ref 3.0–12.0)
Neutro Abs: 2.4 10*3/uL (ref 1.4–7.7)
Neutrophils Relative %: 48.4 % (ref 43.0–77.0)
Platelets: 293 10*3/uL (ref 150.0–400.0)
RBC: 4.5 Mil/uL (ref 3.87–5.11)
RDW: 13.6 % (ref 11.5–15.5)
WBC: 4.9 10*3/uL (ref 4.0–10.5)

## 2023-06-08 LAB — COMPREHENSIVE METABOLIC PANEL WITH GFR
ALT: 14 U/L (ref 0–35)
AST: 15 U/L (ref 0–37)
Albumin: 4.7 g/dL (ref 3.5–5.2)
Alkaline Phosphatase: 49 U/L (ref 39–117)
BUN: 15 mg/dL (ref 6–23)
CO2: 26 meq/L (ref 19–32)
Calcium: 9.2 mg/dL (ref 8.4–10.5)
Chloride: 105 meq/L (ref 96–112)
Creatinine, Ser: 0.82 mg/dL (ref 0.40–1.20)
GFR: 93.25 mL/min (ref >60.00)
Glucose, Bld: 95 mg/dL (ref 70–99)
Potassium: 4.1 meq/L (ref 3.5–5.1)
Sodium: 139 meq/L (ref 135–145)
Total Bilirubin: 0.5 mg/dL (ref 0.2–1.2)
Total Protein: 7.3 g/dL (ref 6.0–8.3)

## 2023-06-08 LAB — HEMOGLOBIN A1C: Hgb A1c MFr Bld: 5.4 % (ref 4.6–6.5)

## 2023-06-08 MED ORDER — CLONAZEPAM 0.5 MG PO TABS: 0.5000 mg | ORAL_TABLET | Freq: Two times a day (BID) | ORAL | 0 refills | Status: AC | PRN

## 2023-06-08 MED ORDER — BUSPIRONE HCL 10 MG PO TABS: 10.0000 mg | ORAL_TABLET | Freq: Three times a day (TID) | ORAL | 1 refills | Status: AC | PRN

## 2023-06-08 MED ORDER — LISDEXAMFETAMINE DIMESYLATE 30 MG PO CAPS: 30.0000 mg | ORAL_CAPSULE | Freq: Every day | ORAL | 0 refills | Status: DC

## 2023-06-08 NOTE — Patient Instructions (Addendum)
 It was great to see you!  Start the 30 mg Vyvanse and see how this works for you -- message me a few days after taking to let me know if you like the medication and dosage  Please go to the lab for blood work.   Our office will call you with your results unless you have chosen to receive results via MyChart.  If your blood work is normal we will follow-up each year for physicals and as scheduled for chronic medical problems.  If anything is abnormal we will treat accordingly and get you in for a follow-up.  Take care,  Lelon Mast

## 2023-06-08 NOTE — Progress Notes (Signed)
 Subjective:    Joann Hebert is a 33 y.o. female and is here for a comprehensive physical exam.  HPI  There are no preventive care reminders to display for this patient.  Acute Concerns: Migraines  Patient recalls having an episode of migraines that occurred over the past year.  She was give flexeril 5 mg at that time which relieved her symptoms, has not been needing to use for quite some time. No concerns or symptoms at this time.  Chronic Issues: Anxiety/Depression  Reports compliance and good tolerance of Wellbutrin XL 300 mg, 50 mg Zoloft daily, 0.5 mg Klonopin and Buspar 10 mg PRN.  She states that she doesn't often take Buspar, only when she's extremely stressed. Patient states that she has been experiencing increased stress at home.  She also reports having trouble sleeping due to her daughter's sleeping schedule.    Obesity Currently managed with Phentermine 37.5 mg. She has been on this for the past year. She states that she has been halving her dose at times so that her body does not get used to it. She has been out of medication for the past week.  Patient has been doing more at home exercises and weekly cycling classes.  Denies any GI symptoms other than mild constipation.   Health Maintenance: Immunizations -- N/A Colonoscopy -- N/A Mammogram -- N/A PAP -- Last done on 05-24-22. Normal  Bone Density -- N/A Diet -- patient states that she has been more aware of her food choices. Occasionally craves sugar.   Exercise -- cycling weekly, at home pilates   Sleep habits -- has been troubling sleeping due to her daughter's sleeping schedule.  Mood -- increased stress at home, but doing well overall.   UTD with dentist? - yes UTD with eye doctor? - no  Weight history: Wt Readings from Last 10 Encounters:  06/08/23 192 lb 8 oz (87.3 kg)  02/12/23 198 lb (89.8 kg)  09/04/22 193 lb 4 oz (87.7 kg)  05/24/22 203 lb (92.1 kg)  04/26/22 217 lb (98.4 kg)  12/30/21 217  lb 4 oz (98.5 kg)  06/15/21 206 lb 2 oz (93.5 kg)  03/09/21 201 lb (91.2 kg)  12/15/20 216 lb 0.8 oz (98 kg)  05/04/20 216 lb (98 kg)   Body mass index is 29.27 kg/m. Patient's last menstrual period was 06/01/2023 (exact date).  Alcohol use:  reports current alcohol use.  Tobacco use:  Tobacco Use: Low Risk  (06/08/2023)   Patient History    Smoking Tobacco Use: Never    Smokeless Tobacco Use: Never    Passive Exposure: Never   Eligible for lung cancer screening? no     02/12/2023   10:13 AM  Depression screen PHQ 2/9  Decreased Interest 1  Down, Depressed, Hopeless 1  PHQ - 2 Score 2  Altered sleeping 2  Tired, decreased energy 2  Change in appetite 2  Feeling bad or failure about yourself  2  Trouble concentrating 1  Moving slowly or fidgety/restless 0  Suicidal thoughts 0  PHQ-9 Score 11  Difficult doing work/chores Somewhat difficult     Other providers/specialists: Patient Care Team: Jarold Motto, [place] as PCP - General (Physician Assistant) Tanda Rockers, NP as Nurse Practitioner (Radiology)    PMHx, SurgHx, SocialHx, Medications, and Allergies were reviewed in the Visit Navigator and updated as appropriate.   Past Medical History:  Diagnosis Date   Anxiety    Depression    Hypertension    Migraines  has tried imitrex     Past Surgical History:  Procedure Laterality Date   HEMANGIOMA EXCISION Right 06/25/2020   elbow   WISDOM TOOTH EXTRACTION Bilateral 2009     Family History  Problem Relation Age of Onset   Depression Father    Diabetes Father    Heart attack Father    Hyperlipidemia Father    Hypertension Father    Depression Brother    Alcohol abuse Maternal Grandmother    Osteoarthritis Maternal Grandmother    Diabetes Maternal Grandmother    Hyperlipidemia Maternal Grandmother    Hypertension Maternal Grandmother    Miscarriages / Stillbirths Maternal Grandmother    Diabetes Maternal Grandfather    Hyperlipidemia  Maternal Grandfather    Hypertension Maternal Grandfather    Kidney disease Maternal Grandfather    Depression Paternal Grandfather    Heart attack Paternal Grandfather    Colon cancer Neg Hx    Breast cancer Neg Hx     Social History   Tobacco Use   Smoking status: Never    Passive exposure: Never   Smokeless tobacco: Never  Vaping Use   Vaping status: Never Used  Substance Use Topics   Alcohol use: Yes    Comment: occassionally   Drug use: Never    Review of Systems:   Review of Systems  Constitutional:  Negative for chills, fever, malaise/fatigue and weight loss.  HENT:  Negative for hearing loss, sinus pain and sore throat.   Respiratory:  Negative for cough and hemoptysis.   Cardiovascular:  Negative for chest pain, palpitations, leg swelling and PND.  Gastrointestinal:  Positive for constipation. Negative for abdominal pain, diarrhea, heartburn, nausea and vomiting.  Genitourinary:  Negative for dysuria, frequency and urgency.  Musculoskeletal:  Negative for back pain, myalgias and neck pain.  Skin:  Negative for itching and rash.  Neurological:  Negative for dizziness, tingling, seizures and headaches.  Endo/Heme/Allergies:  Negative for polydipsia.  Psychiatric/Behavioral:  Negative for depression. The patient is nervous/anxious.     Objective:   BP 110/80 (BP Location: Left Arm, Patient Position: Sitting, Cuff Size: Large)   Pulse 65   Temp (!) 97 F (36.1 C) (Temporal)   Ht 5\' 8"  (1.727 m)   Wt 192 lb 8 oz (87.3 kg)   LMP 06/01/2023 (Exact Date)   SpO2 97%   BMI 29.27 kg/m  Body mass index is 29.27 kg/m.   General Appearance:    Alert, cooperative, no distress, appears stated age  Head:    Normocephalic, without obvious abnormality, atraumatic  Eyes:    PERRL, conjunctiva/corneas clear, EOM's intact, fundi    benign, both eyes  Ears:    Normal TM's and external ear canals, both ears  Nose:   Nares normal, septum midline, mucosa normal, no drainage     or sinus tenderness  Throat:   Lips, mucosa, and tongue normal; teeth and gums normal  Neck:   Supple, symmetrical, trachea midline, no adenopathy;    thyroid:  no enlargement/tenderness/nodules; no carotid   bruit or JVD  Back:     Symmetric, no curvature, ROM normal, no CVA tenderness  Lungs:     Clear to auscultation bilaterally, respirations unlabored  Chest Wall:    No tenderness or deformity   Heart:    Regular rate and rhythm, S1 and S2 normal, no murmur, rub or gallop  Breast Exam:    Deferred  Abdomen:     Soft, non-tender, bowel sounds active all four quadrants,  no masses, no organomegaly  Genitalia:    Deferred  Extremities:   Extremities normal, atraumatic, no cyanosis or edema  Pulses:   2+ and symmetric all extremities  Skin:   Skin color, texture, turgor normal, no rashes or lesions  Lymph nodes:   Cervical, supraclavicular, and axillary nodes normal  Neurologic:   CNII-XII intact, normal strength, sensation and reflexes    throughout    Assessment/Plan:   Routine physical examination Today patient counseled on age appropriate routine health concerns for screening and prevention, each reviewed and up to date or declined. Immunizations reviewed and up to date or declined. Labs ordered and reviewed. Risk factors for depression reviewed and negative. Hearing function and visual acuity are intact. ADLs screened and addressed as needed. Functional ability and level of safety reviewed and appropriate. Education, counseling and referrals performed based on assessed risks today. Patient provided with a copy of personalized plan for preventive services.  Depression, major, single episode, mild (HCC); Anxiety Controlled per patient Continue Wellbutrin XL 300 mg, 50 mg Zoloft daily, 0.5 mg Klonopin and Buspar 10 mg PRN.  Follow up in 3-6 month(s), sooner if concerns  Obesity, unspecified class, unspecified obesity type, unspecified whether serious comorbidity  present Ongoing Will switch to Vyvanse 30 mg daily for better control of food noise Follow up in 3 month(s), sooner if concerns  Elevated glucose Update Hemoglobin A1c and provide recommendations   Hyperlipidemia, unspecified hyperlipidemia type Update lipid panel and provide recommendations   Other migraine with status migrainosus, not intractable Overall stable and well controlled as this time May consider flexeril as needed - this has worked well for her in the past Follow up if new/worsening symptom(s)    Jarold Motto, PA-C  [facility]   I,Safa M Engineer, manufacturing systems as a Neurosurgeon for [employer], PA.,have documented all relevant documentation on the behalf of Jarold Motto, PA,as directed by  Jarold Motto, PA while in the presence of Jarold Motto, [place].   I, Jarold Motto, [place], have reviewed all documentation for this visit. The documentation on 06/08/23 for the exam, diagnosis, procedures, and orders are all accurate and complete.

## 2023-06-11 ENCOUNTER — Encounter: Payer: Self-pay | Admitting: Physician Assistant

## 2023-06-14 ENCOUNTER — Telehealth: Payer: Self-pay | Admitting: *Deleted

## 2023-06-14 MED ORDER — AMPHETAMINE-DEXTROAMPHET ER 10 MG PO CP24: 10.0000 mg | ORAL_CAPSULE | Freq: Every day | ORAL | 0 refills | Status: DC

## 2023-06-14 NOTE — Telephone Encounter (Signed)
 Spoke to pt told her Joann Hebert sent in Adderall XR 10 mg to the pharmacy. Pt verbalized understanding. Told her if it is too expensive please let us know and also you will need to contact insurance to see what is cheaper for you. Pt verbalized understanding.

## 2023-06-14 NOTE — Telephone Encounter (Signed)
 Copied from CRM [id number]. Topic: Clinical - Prescription Issue >> Jun 14, 2023  8:58 AM Sonny Dandy B wrote: Reason for CRM: pt called to advise she can not afford lisdexamfetamine (VYVANSE) 30 MG capsule, the pharmacy is charging her $400.0 a month she is requesting to change the medication to a similar medication  that does not cost that much. . Please call pt back at [phone]

## 2023-06-14 NOTE — Addendum Note (Signed)
 Addended by: Haynes Bast on: 06/14/2023 10:23 AM   Modules accepted: Orders

## 2023-06-14 NOTE — Telephone Encounter (Signed)
 Please see message and advise

## 2023-07-10 ENCOUNTER — Other Ambulatory Visit: Payer: Self-pay | Admitting: Physician Assistant

## 2023-07-10 NOTE — Telephone Encounter (Signed)
 Pt requesting higher dose of Adderall XR.

## 2023-07-11 ENCOUNTER — Other Ambulatory Visit: Payer: Self-pay | Admitting: Physician Assistant

## 2023-07-11 MED ORDER — AMPHETAMINE-DEXTROAMPHET ER 15 MG PO CP24: 15.0000 mg | ORAL_CAPSULE | ORAL | 0 refills | Status: DC

## 2023-08-02 ENCOUNTER — Telehealth: Payer: Self-pay | Admitting: *Deleted

## 2023-08-02 NOTE — Telephone Encounter (Unsigned)
 Copied from CRM [id number]. Topic: General - Other >> Aug 02, 2023  1:19 PM Dorisann Garre T wrote: Reason for CRM: patient is needing a call back regarding birth control she needs to know if she needs to come in and be seen or can she schedule a virtually appt

## 2023-08-02 NOTE — Telephone Encounter (Signed)
 Left message on voicemail to call office. Pt will need to schedule an appointment to discuss birth control. It can be a virtual appt.

## 2023-08-03 ENCOUNTER — Telehealth: Payer: Self-pay | Admitting: Physician Assistant

## 2023-08-03 NOTE — Telephone Encounter (Unsigned)
 Copied from CRM [id number]. Topic: Appointments - Scheduling Inquiry for Clinic >> Aug 03, 2023 11:47 AM Joann Hebert wrote: Reason for CRM: Pt would like to know if they would have to schedule in person or video to get on birth control.pt can be reached [phone]

## 2023-08-10 ENCOUNTER — Telehealth (INDEPENDENT_AMBULATORY_CARE_PROVIDER_SITE_OTHER): Admitting: Physician Assistant

## 2023-08-10 ENCOUNTER — Encounter: Payer: Self-pay | Admitting: Physician Assistant

## 2023-08-10 VITALS — Ht 68.0 in | Wt 190.0 lb

## 2023-08-10 DIAGNOSIS — Z30011 Encounter for initial prescription of contraceptive pills: Secondary | ICD-10-CM | POA: Diagnosis not present

## 2023-08-10 DIAGNOSIS — F909 Attention-deficit hyperactivity disorder, unspecified type: Secondary | ICD-10-CM | POA: Diagnosis not present

## 2023-08-10 MED ORDER — AMPHETAMINE-DEXTROAMPHET ER 20 MG PO CP24: 20.0000 mg | ORAL_CAPSULE | ORAL | 0 refills | Status: DC

## 2023-08-10 MED ORDER — NORETHIN ACE-ETH ESTRAD-FE 1-20 MG-MCG PO TABS: 1.0000 | ORAL_TABLET | Freq: Every day | ORAL | 3 refills | Status: DC

## 2023-08-10 NOTE — Progress Notes (Signed)
 I acted as a Neurosurgeon for [employer], PA-C Jerilyn Monte, LPN  Virtual Visit via Video Note   I, Alexander Iba, PA, connected with  Joann Hebert  ([id number], 1990/07/23) on 08/10/23 at 10:00 AM EDT by a video-enabled telemedicine application and verified that I am speaking with the correct person using two identifiers.  Location: Patient: Home Provider: [facility] [facility] office   I discussed the limitations of evaluation and management by telemedicine and the availability of in person appointments. The patient expressed understanding and agreed to proceed.    History of Present Illness: Joann Hebert is a 33 y.o. who identifies as a female who was assigned female at birth, and is being seen today for:  Contraception Pt would like to discuss starting birth control She was on oral contraceptive pill(s) in the past and tolerated well prior to pregnancy Has migraines but denies auras Denies concerns for sexual transmitted infection or pregnancy  ADHD Currently taking Adderall XR 15 mg daily Tolerating well Denies worsening anxiety or insomnia with this Does have days where this medication wears off too quickly  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    Allergies: No Known Allergies Medications:  Current Outpatient Medications:    amphetamine -dextroamphetamine (ADDERALL XR) 20 MG 24 hr capsule, Take 1 capsule (20 mg total) by mouth every morning., Disp: 30 capsule, Rfl: 0   [START ON 09/09/2023] amphetamine -dextroamphetamine (ADDERALL XR) 20 MG 24 hr capsule, Take 1 capsule (20 mg total) by mouth every morning., Disp: 30 capsule, Rfl: 0   [START ON 10/09/2023] amphetamine -dextroamphetamine (ADDERALL XR) 20 MG 24 hr capsule, Take 1 capsule (20 mg total) by mouth every morning., Disp: 30  capsule, Rfl: 0   buPROPion  (WELLBUTRIN  XL) 300 MG 24 hr tablet, TAKE 1 TABLET BY MOUTH EVERY DAY, Disp: 90 tablet, Rfl: 0   busPIRone  (BUSPAR ) 10 MG tablet, Take 1 tablet (10 mg total) by mouth 3 (three) times daily as needed., Disp: 30 tablet, Rfl: 1   clonazePAM  (KLONOPIN ) 0.5 MG tablet, Take 1 tablet (0.5 mg total) by mouth 2 (two) times daily as needed for anxiety., Disp: 30 tablet, Rfl: 0   cyclobenzaprine  (FLEXERIL ) 5 MG tablet, Take 1-2 tablets (5-10 mg total) by mouth 3 (three) times daily as needed (For migraine pain.)., Disp: 30 tablet, Rfl: 0   norethindrone-ethinyl estradiol-FE (JUNEL FE 1/20) 1-20 MG-MCG tablet, Take 1 tablet by mouth daily., Disp: 84 tablet, Rfl: 3   sertraline  (ZOLOFT ) 50 MG tablet, Take 1 tablet (50 mg total) by mouth daily., Disp: 90 tablet, Rfl: 1  Observations/Objective: Patient is well-developed, well-nourished in no acute distress.  Resting comfortably  at home.  Head is normocephalic, atraumatic.  No labored breathing.  Speech is clear and coherent with logical content.  Patient is alert and oriented at baseline.   Assessment and Plan: 1. Encounter for initial prescription of contraceptive pills (Primary) Reviewed risks and benefits/side effect(s) of medication She is agreeable to start oral contraceptive pill(s) Recommend back-up method for first week of use Follow up in 1 year, sooner if concerns  2. Attention deficit hyperactivity disorder (ADHD), unspecified ADHD type Controlled but room for improvement Increase Adderall XR to 20 mg daily Did discuss that if this doesn't last long enough, for her symptom(s), we may need to add lunch time immediate dose  Follow Up Instructions: I discussed the assessment and treatment plan with the patient. The patient was provided an opportunity to ask questions and all were answered. The patient agreed with the plan and demonstrated an understanding of the instructions.  A copy of instructions were sent to  the patient via MyChart unless otherwise noted below.   The patient was advised to call back or seek an in-person evaluation if the symptoms worsen or if the condition fails to improve as anticipated.  Alexander Iba, [place]

## 2023-10-06 ENCOUNTER — Other Ambulatory Visit: Payer: Self-pay | Admitting: Physician Assistant

## 2023-11-07 ENCOUNTER — Encounter: Payer: Self-pay | Admitting: Physician Assistant

## 2023-11-09 ENCOUNTER — Encounter: Payer: Self-pay | Admitting: Physician Assistant

## 2023-11-09 ENCOUNTER — Telehealth: Admitting: Physician Assistant

## 2023-11-09 VITALS — Ht 68.0 in | Wt 190.0 lb

## 2023-11-09 DIAGNOSIS — F909 Attention-deficit hyperactivity disorder, unspecified type: Secondary | ICD-10-CM | POA: Diagnosis not present

## 2023-11-09 MED ORDER — AMPHETAMINE-DEXTROAMPHET ER 25 MG PO CP24: 25.0000 mg | ORAL_CAPSULE | ORAL | 0 refills | Status: DC

## 2023-11-09 MED ORDER — AMPHETAMINE-DEXTROAMPHETAMINE 5 MG PO TABS: ORAL_TABLET | ORAL | 0 refills | Status: DC

## 2023-11-09 NOTE — Progress Notes (Signed)
   Virtual Visit via Video Note   I, Lucie Buttner, connected with  Joann Hebert  ([id number], 25-Aug-1990) on 11/09/23 at  9:00 AM EDT by a video-enabled telemedicine application and verified that I am speaking with the correct person using two identifiers.  Location: Patient: Home Provider: [place] [facility] office   I discussed the limitations of evaluation and management by telemedicine and the availability of in person appointments. The patient expressed understanding and agreed to proceed.    History of Present Illness: Joann Hebert is a 33 yo female who is being seen for ADHD.   She is currently taking Adderall XR 20 mg daily. She is tolerating well without any insomnia. She denies palpitations or worsening anxiety with this medication. She does feel that the medication wears off around 3 PM every day. She is wondering if she can change her regimen or increase dose.   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    Allergies: No Known Allergies Medications:  Current Outpatient Medications:    amphetamine -dextroamphetamine  (ADDERALL XR) 25 MG 24 hr capsule, Take 1 capsule by mouth every morning., Disp: 30 capsule, Rfl: 0   [START ON 12/09/2023] amphetamine -dextroamphetamine  (ADDERALL XR) 25 MG 24 hr capsule, Take 1 capsule by mouth every morning., Disp: 30 capsule, Rfl: 0   [START ON 01/08/2024] amphetamine -dextroamphetamine  (ADDERALL XR) 25 MG 24 hr capsule, Take 1 capsule by mouth every morning., Disp: 30 capsule, Rfl: 0   amphetamine -dextroamphetamine  (ADDERALL) 5 MG tablet, Take 5-10 mg daily at lunch as needed., Disp: 30 tablet, Rfl: 0   buPROPion  (WELLBUTRIN  XL) 300 MG 24 hr tablet, TAKE 1 TABLET BY MOUTH EVERY DAY, Disp: 90 tablet, Rfl: 0   busPIRone  (BUSPAR ) 10 MG tablet, Take 1 tablet (10 mg  total) by mouth 3 (three) times daily as needed., Disp: 30 tablet, Rfl: 1   clonazePAM  (KLONOPIN ) 0.5 MG tablet, Take 1 tablet (0.5 mg total) by mouth 2 (two) times daily as needed for anxiety., Disp: 30 tablet, Rfl: 0   cyclobenzaprine  (FLEXERIL ) 5 MG tablet, Take 1-2 tablets (5-10 mg total) by mouth 3 (three) times daily as needed (For migraine pain.)., Disp: 30 tablet, Rfl: 0   sertraline  (ZOLOFT ) 50 MG tablet, Take 1 tablet (50 mg total) by mouth daily., Disp: 90 tablet, Rfl: 1  Observations/Objective: Patient is well-developed, well-nourished in no acute distress.  Resting comfortably  at home.  Head is normocephalic, atraumatic.  No labored breathing.  Speech is clear and coherent with logical content.  Patient is alert and oriented at baseline.   Assessment and Plan: 1. Attention deficit hyperactivity disorder (ADHD), unspecified ADHD type (Primary) Uncontrolled Increase Adderall XR to 25 mg daily Add Adderall immediate release 5 to 10 mg daily at lunch as needed Follow-up in 3 months, sooner if concerns  Follow Up Instructions: I discussed the assessment and treatment plan with the patient. The patient was provided an opportunity to ask questions and all were answered. The patient agreed with the plan and demonstrated an understanding of the instructions.  A copy of instructions were sent to the patient via MyChart unless otherwise noted below.   The patient was advised to call back or seek an in-person evaluation if the symptoms worsen or if the condition fails to improve as anticipated.  Lucie Buttner, [place]

## 2023-11-22 ENCOUNTER — Other Ambulatory Visit: Payer: Self-pay | Admitting: Physician Assistant

## 2023-12-10 ENCOUNTER — Encounter: Payer: Self-pay | Admitting: Physician Assistant

## 2023-12-11 ENCOUNTER — Other Ambulatory Visit: Payer: Self-pay | Admitting: Physician Assistant

## 2023-12-11 MED ORDER — SLYND 4 MG PO TABS: ORAL_TABLET | ORAL | 2 refills | Status: DC

## 2023-12-17 ENCOUNTER — Other Ambulatory Visit: Payer: Self-pay | Admitting: Physician Assistant

## 2023-12-17 NOTE — Telephone Encounter (Unsigned)
 Copied from CRM [id number]. Topic: Clinical - Medication Refill >> Dec 17, 2023  8:18 AM Carlyon D wrote: Medication: amphetamine -dextroamphetamine  (ADDERALL XR) 25 MG 24 hr capsule  Has the patient contacted their pharmacy? Yes (Agent: If no, request that the patient contact the pharmacy for the refill. If patient does not wish to contact the pharmacy document the reason why and proceed with request.) (Agent: If yes, when and what did the pharmacy advise?)  This is the patient's preferred pharmacy:  [id number] [address], [place] - [address] [place] [address] Phone: [phone] Fax: [phone]  Is this the correct pharmacy for this prescription? Yes If no, delete pharmacy and type the correct one.   Has the prescription been filled recently? No  Is the patient out of the medication? No, pt has 2 days left.   Has the patient been seen for an appointment in the last year OR does the patient have an upcoming appointment? Yes  Can we respond through MyChart? Yes  Agent: Please be advised that Rx refills may take up to 3 business days. We ask that you follow-up with your pharmacy.

## 2023-12-17 NOTE — Telephone Encounter (Signed)
 06/08/2023 LOV  11/09/2023 Fill date  30/0 refills

## 2023-12-21 ENCOUNTER — Telehealth: Payer: Self-pay | Admitting: *Deleted

## 2023-12-21 NOTE — Telephone Encounter (Signed)
 Called and left detailed message on personal voicemail. I called the pharmacy and they are getting Adderall Rx ready for you and they said they contacted you. Also when you need a refill you can not do it on the app you need to contact the pharmacy. Also to remind you that you will need an appt with Lucie in Nov before your Rx runs out. If you have any questions please call office. Have a good day.

## 2023-12-21 NOTE — Telephone Encounter (Signed)
 Copied from CRM [id number]. Topic: Clinical - Medication Question >> Dec 21, 2023 11:37 AM Thersia BROCKS wrote: Reason for CRM: Patient called in regarding her prescription amphetamine -dextroamphetamine  (ADDERALL XR) 25 MG 24 hr capsule , stated that pharmacy stated they will have to contact provider for refills, I did inform patient that prescription was sent in for  3 months for the medication. Would like for someone follow up with her on this  [facility] and [facility]. She said they are getting Rx ready for pt and she was contacted. Told her okay, thank you.

## 2024-01-08 ENCOUNTER — Other Ambulatory Visit: Payer: Self-pay | Admitting: Physician Assistant

## 2024-01-08 ENCOUNTER — Ambulatory Visit: Admitting: Radiology

## 2024-01-08 ENCOUNTER — Encounter: Payer: Self-pay | Admitting: Radiology

## 2024-01-08 VITALS — BP 116/78 | HR 105 | Ht 68.0 in | Wt 200.0 lb

## 2024-01-08 DIAGNOSIS — Z1331 Encounter for screening for depression: Secondary | ICD-10-CM

## 2024-01-08 DIAGNOSIS — Z01419 Encounter for gynecological examination (general) (routine) without abnormal findings: Secondary | ICD-10-CM

## 2024-01-08 DIAGNOSIS — Z3041 Encounter for surveillance of contraceptive pills: Secondary | ICD-10-CM

## 2024-01-08 MED ORDER — SLYND 4 MG PO TABS: 1.0000 | ORAL_TABLET | Freq: Every day | ORAL | 4 refills | Status: AC

## 2024-01-08 NOTE — Progress Notes (Signed)
 Annaliesa Blann 1990-06-29 [id number]   History:  33 y.o. G1P1 presents for annual exam. Was given Rx for Junel bc PCP, made periods heavier and migraines worse. Has tried micronor previously and she had prolonged bleeding. Does not want an IUD or implant.   Gynecologic History Patient's last menstrual period was 01/03/2024 (exact date). Period Cycle (Days): 28 Period Duration (Days): 3-5 Period Pattern: Regular Menstrual Flow: Heavy, Light Menstrual Control: Tampon Dysmenorrhea: (!) Moderate Dysmenorrhea Symptoms: Cramping Contraception/Family planning: condoms Sexually active: yes Last Pap: 2024 normal  Obstetric History OB History  Gravida Para Term Preterm AB Living  1 1  1  1   SAB IAB Ectopic Multiple Live Births     0 1    # Outcome Date GA Lbr Len/2nd Weight Sex Type Anes PTL Lv  1 Preterm 09/21/18 [redacted]w[redacted]d 18:31 / 01:12 8 lb 0.9 oz (3.654 kg) F Vag-Spont EPI  LIV       01/08/2024    3:43 PM 11/09/2023    8:54 AM 06/08/2023    9:42 AM  Depression screen PHQ 2/9  Decreased Interest 0 1 1  Down, Depressed, Hopeless 1 1 1   PHQ - 2 Score 1 2 2   Altered sleeping  1 2  Tired, decreased energy  1 0  Change in appetite  1 2  Feeling bad or failure about yourself   1 1  Trouble concentrating  1 1  Moving slowly or fidgety/restless  0 0  Suicidal thoughts  0 0  PHQ-9 Score  7 8  Difficult doing work/chores  Not difficult at all Somewhat difficult     The following portions of the patient's history were reviewed and updated as appropriate: allergies, current medications, past family history, past medical history, past social history, past surgical history, and problem list.  Review of Systems  All other systems reviewed and are negative.   Past medical history, past surgical history, family history and social history were all reviewed and documented in the EPIC chart.  Exam:  Vitals:   01/08/24 1541  BP: 116/78  Pulse: (!) 105  SpO2: 99%  Weight: 200 lb (90.7 kg)   Height: 5' 8 (1.727 m)   Body mass index is 30.41 kg/m.  Physical Exam Vitals and nursing note reviewed. Exam conducted with a chaperone present.  Constitutional:      Appearance: Normal appearance. She is normal weight.  HENT:     Head: Normocephalic and atraumatic.  Neck:     Thyroid: No thyroid mass, thyromegaly or thyroid tenderness.  Cardiovascular:     Rate and Rhythm: Regular rhythm.     Heart sounds: Normal heart sounds.  Pulmonary:     Effort: Pulmonary effort is normal.     Breath sounds: Normal breath sounds.  Chest:  Breasts:    Breasts are symmetrical.     Right: Normal. No inverted nipple, mass, nipple discharge, skin change or tenderness.     Left: Normal. No inverted nipple, mass, nipple discharge, skin change or tenderness.  Abdominal:     General: Abdomen is flat. Bowel sounds are normal.     Palpations: Abdomen is soft.  Genitourinary:    General: Normal vulva.     Vagina: Normal. No vaginal discharge, bleeding or lesions.     Cervix: Normal. No discharge or lesion.     Uterus: Normal. Not enlarged and not tender.      Adnexa: Right adnexa normal and left adnexa normal.       Right:  No mass, tenderness or fullness.         Left: No mass, tenderness or fullness.    Lymphadenopathy:     Upper Body:     Right upper body: No axillary adenopathy.     Left upper body: No axillary adenopathy.  Skin:    General: Skin is warm and dry.  Neurological:     Mental Status: She is alert and oriented to person, place, and time.  Psychiatric:        Mood and Affect: Mood normal.        Thought Content: Thought content normal.        Judgment: Judgment normal.      Darice Hoit, CMA present for exam  Assessment/Plan:   1. Well woman exam with routine gynecological exam (Primary) Pap 2027  2. Oral contraceptive pill surveillance - Drospirenone (SLYND) 4 MG TABS; Take 1 tablet (4 mg total) by mouth daily.  Dispense: 84 tablet; Refill: 4  3. Depression  screening    Return in about 1 year (around 01/07/2025) for Annual.  Sanvika Cuttino B WHNP-BC 4:00 PM 01/08/2024

## 2024-01-08 NOTE — Telephone Encounter (Unsigned)
 Copied from CRM #[id number]. Topic: Clinical - Medication Refill >> Jan 08, 2024  3:07 PM Tysheama G wrote: Medication: amphetamine -dextroamphetamine  (ADDERALL) 5 MG tablet  Has the patient contacted their pharmacy? Yes (Agent: If no, request that the patient contact the pharmacy for the refill. If patient does not wish to contact the pharmacy document the reason why and proceed with request.) (Agent: If yes, when and what did the pharmacy advise?)  This is the patient's preferred pharmacy:  [id number] [address], [place] - [address] [place] [address] Phone: [phone] Fax: [phone]  Is this the correct pharmacy for this prescription? Yes If no, delete pharmacy and type the correct one.   Has the prescription been filled recently? No  Is the patient out of the medication? Yes  Has the patient been seen for an appointment in the last year OR does the patient have an upcoming appointment? Yes  Can we respond through MyChart? Yes  Agent: Please be advised that Rx refills may take up to 3 business days. We ask that you follow-up with your pharmacy.

## 2024-01-08 NOTE — Patient Instructions (Signed)

## 2024-01-09 MED ORDER — AMPHETAMINE-DEXTROAMPHETAMINE 5 MG PO TABS: ORAL_TABLET | ORAL | 0 refills | Status: DC

## 2024-01-11 ENCOUNTER — Other Ambulatory Visit: Payer: Self-pay | Admitting: Physician Assistant

## 2024-01-11 ENCOUNTER — Telehealth: Payer: Self-pay | Admitting: *Deleted

## 2024-01-11 MED ORDER — AMPHETAMINE-DEXTROAMPHETAMINE 5 MG PO TABS: ORAL_TABLET | ORAL | 0 refills | Status: DC

## 2024-01-11 NOTE — Telephone Encounter (Signed)
 [facility] diagnosis code for Adderall 5 mg tablet. Please advise.

## 2024-01-11 NOTE — Telephone Encounter (Signed)
 Noted

## 2024-01-14 ENCOUNTER — Ambulatory Visit: Payer: Self-pay

## 2024-01-14 ENCOUNTER — Other Ambulatory Visit: Payer: Self-pay | Admitting: Physician Assistant

## 2024-01-14 NOTE — Telephone Encounter (Signed)
 FYI Only or Action Required?: FYI only for provider: appointment scheduled on 11/4.  Patient was last seen in primary care on 11/09/2023 by Job Lukes, PA.  Called Nurse Triage reporting Back Pain.  Symptoms began several days ago.  Interventions attempted: OTC medications: Tylenol  and Ibuprofen , Rest, hydration, or home remedies, and Ice/heat application.  Symptoms are: gradually worsening.  Triage Disposition: See PCP When Office is Open (Within 3 Days)  Patient/caregiver understands and will follow disposition?: Yes  Copied from CRM [id number]. Topic: Clinical - Red Word Triage >> Jan 14, 2024  3:44 PM Suzen RAMAN wrote: Red Word that prompted transfer to Nurse Triage: lower back pain, possible pinched nerves, difficulty walking and pain radiates with certain movements. Requesting an appt Reason for Disposition  [1] MODERATE back pain (e.g., interferes with normal activities) AND [2] present > 3 days  Answer Assessment - Initial Assessment Questions 1. ONSET: When did the pain begin? (e.g., minutes, hours, days)     Started 2 days ago  2. LOCATION: Where does it hurt? (upper, mid or lower back)     Lower back  3. SEVERITY: How bad is the pain?  (e.g., Scale 1-10; mild, moderate, or severe)     6/10 when sitting and 10/10 when standing/walking  4. PATTERN: Is the pain constant? (e.g., yes, no; constant, intermittent)      Walking  5. RADIATION: Does the pain shoot into your legs or somewhere else?     Radiates down leg  6. CAUSE:  What do you think is causing the back pain?      Thinks she may have slept wrong Friday, then she was sitting on floor Saturday afternoon  7. BACK OVERUSE:  Any recent lifting of heavy objects, strenuous work or exercise?     No  8. MEDICINES: What have you taken so far for the pain? (e.g., nothing, acetaminophen , NSAIDS)     Ibuprofen , Tylenol   9. NEUROLOGIC SYMPTOMS: Do you have any weakness, numbness, or problems with  bowel/bladder control?     No  10. OTHER SYMPTOMS: Do you have any other symptoms? (e.g., fever, abdomen pain, burning with urination, blood in urine)       Worse with walking  11. PREGNANCY: Is there any chance you are pregnant? When was your last menstrual period?       No; LMP-10/23  Protocols used: Back Pain-A-AH

## 2024-01-14 NOTE — Telephone Encounter (Signed)
 Appt tomorrow

## 2024-01-15 ENCOUNTER — Ambulatory Visit: Admitting: Physician Assistant

## 2024-01-15 VITALS — BP 102/66 | HR 89 | Temp 98.0°F | Ht 68.0 in | Wt 199.0 lb

## 2024-01-15 DIAGNOSIS — Z23 Encounter for immunization: Secondary | ICD-10-CM | POA: Diagnosis not present

## 2024-01-15 DIAGNOSIS — M545 Low back pain, unspecified: Secondary | ICD-10-CM | POA: Diagnosis not present

## 2024-01-15 MED ORDER — METHOCARBAMOL 500 MG PO TABS: 500.0000 mg | ORAL_TABLET | Freq: Four times a day (QID) | ORAL | 1 refills | Status: AC | PRN

## 2024-01-15 MED ORDER — PREDNISONE 20 MG PO TABS: 40.0000 mg | ORAL_TABLET | Freq: Every day | ORAL | 0 refills | Status: AC

## 2024-01-15 NOTE — Progress Notes (Signed)
 Joann Hebert is a 33 y.o. female here for an acute issue.  History of Present Illness:   Chief Complaint  Patient presents with   Back Pain    Pt c/o low back pain x several days, trouble walking. Has been taking Ibuprofen  & Tylenol , no relief. Tried a Flexeril  5 mg did not help. Denies urinary symptoms.    Discussed the use of AI scribe software for clinical note transcription with the patient, who gave verbal consent to proceed.  History of Present Illness   Joann Hebert is a 33 year old female with a history of pinched nerves who presents with severe lower back pain.  She experiences severe central lower back pain that radiates across her lower back and is aggravated by certain movements, occasionally causing her leg to quake. The pain began after sleeping with her dog and daughter in the bed and attending a child's party. It progressively worsened, making it uncomfortable to stand by the end of the day. By Sunday, she was using a heating pad all day and had difficulty bending forward, needing to squat instead.  She has tried Flexeril , ibuprofen , and Tylenol  without relief. Nerve flossing stretches helped pinpoint the pain but did not alleviate it. She takes Tylenol  and ibuprofen  twice a day but often forgets to take it regularly. She has a few Flexeril  tablets left from a previous prescription but is unsure of the effective dosage.  There is no pain radiating down her legs, loss of bladder or bowel control, or numbness in her inner thighs. The pain sometimes shifts from the left to the right side of her lower back.  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        Past Medical History:  Diagnosis Date   Anxiety    Depression    Hypertension    Migraines    has tried imitrex     Social History   Tobacco Use   Smoking status: Never    Passive exposure: Never    Smokeless tobacco: Never  Vaping Use   Vaping status: Never Used  Substance Use Topics   Alcohol use: Yes    Comment: occassionally   Drug use: Never    Past Surgical History:  Procedure Laterality Date   HEMANGIOMA EXCISION Right 06/25/2020   elbow   WISDOM TOOTH EXTRACTION Bilateral 2009    Family History  Problem Relation Age of Onset   Depression Father    Diabetes Father    Heart attack Father    Hyperlipidemia Father    Hypertension Father    Depression Brother    Alcohol abuse Maternal Grandmother    Osteoarthritis Maternal Grandmother    Diabetes Maternal Grandmother    Hyperlipidemia Maternal Grandmother    Hypertension Maternal Grandmother    Miscarriages / Stillbirths Maternal Grandmother    Diabetes Maternal Grandfather    Hyperlipidemia Maternal Grandfather    Hypertension Maternal Grandfather    Kidney disease Maternal Grandfather    Depression Paternal Grandfather    Heart attack Paternal Grandfather    Colon cancer Neg Hx    Breast cancer Neg Hx     No Known Allergies  Current Medications:   Current Outpatient Medications:    amphetamine -dextroamphetamine  (ADDERALL XR) 25 MG 24 hr capsule, Take 1 capsule by mouth every morning., Disp: 30 capsule, Rfl: 0   amphetamine -dextroamphetamine  (ADDERALL XR) 25 MG 24 hr capsule, Take 1  capsule by mouth every morning., Disp: 30 capsule, Rfl: 0   amphetamine -dextroamphetamine  (ADDERALL XR) 25 MG 24 hr capsule, Take 1 capsule by mouth every morning., Disp: 30 capsule, Rfl: 0   amphetamine -dextroamphetamine  (ADDERALL) 5 MG tablet, Take 5-10 mg daily at lunch as needed., Disp: 30 tablet, Rfl: 0   buPROPion  (WELLBUTRIN  XL) 300 MG 24 hr tablet, TAKE 1 TABLET BY MOUTH EVERY DAY, Disp: 90 tablet, Rfl: 0   busPIRone  (BUSPAR ) 10 MG tablet, Take 1 tablet (10 mg total) by mouth 3 (three) times daily as needed., Disp: 30 tablet, Rfl: 1   clonazePAM  (KLONOPIN ) 0.5 MG tablet, Take 1 tablet (0.5 mg total) by mouth 2 (two)  times daily as needed for anxiety., Disp: 30 tablet, Rfl: 0   Drospirenone (SLYND) 4 MG TABS, Take 1 tablet (4 mg total) by mouth daily., Disp: 84 tablet, Rfl: 4   methocarbamol (ROBAXIN) 500 MG tablet, Take 1 tablet (500 mg total) by mouth every 6 (six) hours as needed for muscle spasms., Disp: 30 tablet, Rfl: 1   predniSONE (DELTASONE) 20 MG tablet, Take 2 tablets (40 mg total) by mouth daily with breakfast., Disp: 10 tablet, Rfl: 0   sertraline  (ZOLOFT ) 50 MG tablet, TAKE 1 TABLET BY MOUTH EVERY DAY, Disp: 90 tablet, Rfl: 1   Review of Systems:   Negative unless otherwise specified per HPI.  Vitals:   Vitals:   01/15/24 1057  BP: 102/66  Pulse: 89  Temp: 98 F (36.7 C)  TempSrc: Temporal  SpO2: 97%  Weight: 199 lb (90.3 kg)  Height: 5' 8 (1.727 m)     Body mass index is 30.26 kg/m.  Physical Exam:   Physical Exam Constitutional:      Appearance: Normal appearance. She is well-developed.  HENT:     Head: Normocephalic and atraumatic.  Eyes:     General: Lids are normal.     Extraocular Movements: Extraocular movements intact.     Conjunctiva/sclera: Conjunctivae normal.  Pulmonary:     Effort: Pulmonary effort is normal.  Musculoskeletal:        General: Normal range of motion.     Cervical back: Normal range of motion and neck supple.     Comments: No decreased ROM 2/2 pain lateral side bends or rotation.  Decreased ROM with flexion/extension. Reproducible tenderness with deep palpation to bilateral lumbar paraspinal muscles. No bony tenderness. No evidence of erythema, rash or ecchymosis. Negative STLR bilaterally.   Skin:    General: Skin is warm and dry.  Neurological:     Mental Status: She is alert and oriented to person, place, and time.  Psychiatric:        Attention and Perception: Attention and perception normal.        Mood and Affect: Mood normal.        Behavior: Behavior normal.        Thought Content: Thought content normal.        Judgment:  Judgment normal.     Assessment and Plan:   Assessment and Plan    Acute bilateral low back pain without sciatica  Acute central lower back pain, non-muscular, exacerbated by movement, without radicular symptoms or neurological deficits. Current pain management with Tylenol  and ibuprofen  is helping. No need for MRI or CT scan at this time. Oral steroids preferred over injection due to potential side effects and lack of recent steroid use history. - Prescribed oral steroids with instructions to take with food and avoid late afternoon/evening dosing to  prevent insomnia. - Prescribed Robaxin 500 mg as a muscle relaxant, with instructions to possibly take during the day if it does not cause drowsiness. - Shared decision making --> we decided against immediate back x-ray; will consider if no improvement with current treatment. - Sent prescriptions to pharmacy.   Lucie Buttner, PA-C

## 2024-01-21 ENCOUNTER — Encounter: Payer: Self-pay | Admitting: Physician Assistant

## 2024-01-21 NOTE — Telephone Encounter (Signed)
Dr. Kulik, please see message and advise. 

## 2024-01-22 ENCOUNTER — Other Ambulatory Visit: Payer: Self-pay | Admitting: Physician Assistant

## 2024-01-22 DIAGNOSIS — M545 Low back pain, unspecified: Secondary | ICD-10-CM

## 2024-02-11 ENCOUNTER — Ambulatory Visit: Admitting: Family Medicine

## 2024-03-04 ENCOUNTER — Other Ambulatory Visit: Payer: Self-pay | Admitting: Physician Assistant

## 2024-03-04 NOTE — Telephone Encounter (Signed)
 Copied from CRM #[id number]. Topic: Clinical - Prescription Issue >> Mar 04, 2024  3:53 PM Alexandria E wrote: Reason for CRM: Patient is needing a refill of her amphetamine -dextroamphetamine  (ADDERALL XR) 25 MG 24 hr capsule, but agent noticed this medication ended last month in November. Please reach out to patient if a refill can be submitted for the 25 mg dosage.

## 2024-03-04 NOTE — Telephone Encounter (Signed)
 Copied from CRM #[id number]. Topic: Clinical - Medication Refill >> Mar 04, 2024  3:41 PM Alexandria E wrote: Medication: amphetamine -dextroamphetamine  (ADDERALL) 5 MG tablet   Has the patient contacted their pharmacy? Yes (Agent: If no, request that the patient contact the pharmacy for the refill. If patient does not wish to contact the pharmacy document the reason why and proceed with request.) (Agent: If yes, when and what did the pharmacy advise?)  This is the patient's preferred pharmacy:  [id number] [address], [place] - [address] [place] [address] Phone: [phone] Fax: [phone]    Is this the correct pharmacy for this prescription? Yes If no, delete pharmacy and type the correct one.   Has the prescription been filled recently? No  Is the patient out of the medication? Yes  Has the patient been seen for an appointment in the last year OR does the patient have an upcoming appointment? Yes  Can we respond through MyChart? Yes  Agent: Please be advised that Rx refills may take up to 3 business days. We ask that you follow-up with your pharmacy.

## 2024-03-05 ENCOUNTER — Other Ambulatory Visit: Payer: Self-pay | Admitting: Physician Assistant

## 2024-03-05 MED ORDER — AMPHETAMINE-DEXTROAMPHET ER 25 MG PO CP24: 25.0000 mg | ORAL_CAPSULE | ORAL | 0 refills | Status: AC

## 2024-03-11 ENCOUNTER — Telehealth: Payer: Self-pay

## 2024-03-11 NOTE — Telephone Encounter (Signed)
 Copied from CRM #[id number]. Topic: Clinical - Prescription Issue >> Mar 04, 2024  3:53 PM Alexandria E wrote: Reason for CRM: Patient is needing a refill of her amphetamine -dextroamphetamine  (ADDERALL XR) 25 MG 24 hr capsule, but agent noticed this medication ended last month in November. Please reach out to patient if a refill can be submitted for the 25 mg dosage. >> Mar 11, 2024 10:29 AM Emylou G wrote: Patient call.. checking status adderall medication.. pharmacy waiting on us ... pls update patient   Called pharmacy since last Rx sent 03/05/24, was advised they needed Dx code to process refill. Advised F90.9 dx code and pharmacist states will get refill processed for patient now. Nothing further needed at this time.

## 2024-03-14 ENCOUNTER — Telehealth: Payer: Self-pay

## 2024-03-14 ENCOUNTER — Other Ambulatory Visit: Payer: Self-pay | Admitting: Physician Assistant

## 2024-03-14 MED ORDER — AMPHETAMINE-DEXTROAMPHETAMINE 5 MG PO TABS: 5.0000 mg | ORAL_TABLET | Freq: Every day | ORAL | 0 refills | Status: AC

## 2024-03-14 NOTE — Telephone Encounter (Signed)
 Pt would like Rx, Amphetamine -dextroamphetamine  (ADDERALL) 5 MG tablet sent into [id number] - [place], [place] - [address]   Please review and advise

## 2024-03-14 NOTE — Telephone Encounter (Signed)
 Copied from CRM #[id number]. Topic: Clinical - Prescription Issue >> Mar 04, 2024  3:53 PM Alexandria E wrote: Reason for CRM: Patient is needing a refill of her amphetamine -dextroamphetamine  (ADDERALL XR) 25 MG 24 hr capsule, but agent noticed this medication ended last month in November. Please reach out to patient if a refill can be submitted for the 25 mg dosage. >> Mar 12, 2024  3:26 PM Thersia C wrote: Patient called in stated she received the 25 mg and not the 5 mg would like a callback regarding this  >> Mar 11, 2024 10:29 AM Emylou G wrote: Patient call.. checking status adderall medication.. pharmacy waiting on us ... pls update patient   RX sent on 12/24, I left a voicemail for patient in regards.

## 2024-04-10 ENCOUNTER — Telehealth: Payer: Self-pay | Admitting: *Deleted

## 2024-04-10 NOTE — Telephone Encounter (Signed)
 Copied from CRM #[id number]. Topic: General - Other >> Apr 10, 2024 10:30 AM Adelita E wrote: Reason for CRM: Patient is needing PCP to fill out a letter of approval for her dog to become an emotional support animal. Please reach out to patient if this is something PCP is able to do.

## 2024-04-18 ENCOUNTER — Telehealth: Payer: Self-pay | Admitting: *Deleted

## 2024-04-18 NOTE — Telephone Encounter (Signed)
 Pt requesting refill for Adderall XR 25 mg. Last OV 01/2024.

## 2024-04-18 NOTE — Telephone Encounter (Signed)
 Copied from CRM #[id number]. Topic: Clinical - Medication Refill >> Apr 17, 2024  3:25 PM Viola F wrote: Medication: amphetamine -dextroamphetamine  (ADDERALL XR) 25 MG 24 hr capsule [[id number]]  ENDED  Has the patient contacted their pharmacy? Yes (Agent: If no, request that the patient contact the pharmacy for the refill. If patient does not wish to contact the pharmacy document the reason why and proceed with request.) (Agent: If yes, when and what did the pharmacy advise?)  This is the patient's preferred pharmacy:   [id number] [address], [place] - [address] [place] [address] Phone: [phone] Fax: [phone]  Is this the correct pharmacy for this prescription? Yes If no, delete pharmacy and type the correct one.   Has the prescription been filled recently? Yes  Is the patient out of the medication? Yes, 2 days  Has the patient been seen for an appointment in the last year OR does the patient have an upcoming appointment? Yes  Can we respond through MyChart? Yes  Agent: Please be advised that Rx refills may take up to 3 business days. We ask that you follow-up with your pharmacy.

## 2024-04-22 ENCOUNTER — Ambulatory Visit: Admitting: Physician Assistant
